# Patient Record
Sex: Male | Born: 1975 | Race: White | Hispanic: No | Marital: Single | Smoking: Never smoker
Health system: Southern US, Community
[De-identification: ages and names within clinical notes are randomized; demographics above are authoritative.]

## PROBLEM LIST (undated history)

## (undated) DIAGNOSIS — F419 Anxiety disorder, unspecified: Secondary | ICD-10-CM

## (undated) DIAGNOSIS — J189 Pneumonia, unspecified organism: Secondary | ICD-10-CM

## (undated) HISTORY — PX: SEPTOPLASTY: SUR1290

## (undated) HISTORY — DX: Pneumonia, unspecified organism: J18.9

## (undated) HISTORY — PX: MYRINGOTOMY: SUR874

---

## 2001-05-08 ENCOUNTER — Inpatient Hospital Stay (HOSPITAL_COMMUNITY): Admission: EM | Admit: 2001-05-08 | Discharge: 2001-05-11 | Payer: Self-pay | Admitting: Internal Medicine

## 2001-06-24 ENCOUNTER — Encounter: Payer: Self-pay | Admitting: Urology

## 2001-06-24 ENCOUNTER — Encounter: Admission: RE | Admit: 2001-06-24 | Discharge: 2001-06-24 | Payer: Self-pay | Admitting: Urology

## 2001-11-19 ENCOUNTER — Ambulatory Visit (HOSPITAL_COMMUNITY): Admission: RE | Admit: 2001-11-19 | Discharge: 2001-11-19 | Payer: Self-pay | Admitting: Urology

## 2004-12-07 ENCOUNTER — Ambulatory Visit: Payer: Self-pay | Admitting: Family Medicine

## 2004-12-28 ENCOUNTER — Ambulatory Visit: Payer: Self-pay | Admitting: Family Medicine

## 2005-03-02 ENCOUNTER — Ambulatory Visit: Payer: Self-pay | Admitting: Family Medicine

## 2007-11-26 ENCOUNTER — Ambulatory Visit (HOSPITAL_BASED_OUTPATIENT_CLINIC_OR_DEPARTMENT_OTHER): Admission: RE | Admit: 2007-11-26 | Discharge: 2007-11-26 | Payer: Self-pay | Admitting: Otolaryngology

## 2008-04-03 ENCOUNTER — Ambulatory Visit: Payer: Self-pay | Admitting: Cardiovascular Disease

## 2008-04-11 ENCOUNTER — Emergency Department (HOSPITAL_COMMUNITY): Admission: EM | Admit: 2008-04-11 | Discharge: 2008-04-11 | Payer: Self-pay | Admitting: Emergency Medicine

## 2008-06-10 ENCOUNTER — Ambulatory Visit: Payer: Self-pay | Admitting: Gastroenterology

## 2008-06-10 DIAGNOSIS — R079 Chest pain, unspecified: Secondary | ICD-10-CM

## 2008-06-10 DIAGNOSIS — K219 Gastro-esophageal reflux disease without esophagitis: Secondary | ICD-10-CM

## 2008-06-12 ENCOUNTER — Encounter: Payer: Self-pay | Admitting: Gastroenterology

## 2008-06-12 DIAGNOSIS — R7989 Other specified abnormal findings of blood chemistry: Secondary | ICD-10-CM | POA: Insufficient documentation

## 2008-06-12 LAB — CONVERTED CEMR LAB
ALT: 62 units/L — ABNORMAL HIGH (ref 0–53)
AST: 41 units/L — ABNORMAL HIGH (ref 0–37)
Albumin: 4.7 g/dL (ref 3.5–5.2)
Alkaline Phosphatase: 65 units/L (ref 39–117)
BUN: 14 mg/dL (ref 6–23)
Basophils Absolute: 0 10*3/uL (ref 0.0–0.1)
Basophils Relative: 0.6 % (ref 0.0–3.0)
CO2: 30 meq/L (ref 19–32)
Calcium: 9.9 mg/dL (ref 8.4–10.5)
Chloride: 102 meq/L (ref 96–112)
Creatinine, Ser: 1.1 mg/dL (ref 0.4–1.5)
Eosinophils Absolute: 0.2 10*3/uL (ref 0.0–0.7)
Eosinophils Relative: 3.1 % (ref 0.0–5.0)
GFR calc Af Amer: 100 mL/min
GFR calc non Af Amer: 83 mL/min
Glucose, Bld: 100 mg/dL — ABNORMAL HIGH (ref 70–99)
HCT: 45.1 % (ref 39.0–52.0)
Hemoglobin: 16.1 g/dL (ref 13.0–17.0)
Lymphocytes Relative: 33.6 % (ref 12.0–46.0)
MCHC: 35.8 g/dL (ref 30.0–36.0)
MCV: 89.5 fL (ref 78.0–100.0)
Monocytes Absolute: 0.6 10*3/uL (ref 0.1–1.0)
Monocytes Relative: 9 % (ref 3.0–12.0)
Neutro Abs: 4 10*3/uL (ref 1.4–7.7)
Neutrophils Relative %: 53.7 % (ref 43.0–77.0)
Platelets: 251 10*3/uL (ref 150–400)
Potassium: 4.2 meq/L (ref 3.5–5.1)
RBC: 5.04 M/uL (ref 4.22–5.81)
RDW: 12.7 % (ref 11.5–14.6)
Sodium: 140 meq/L (ref 135–145)
Total Bilirubin: 0.7 mg/dL (ref 0.3–1.2)
Total Protein: 7.9 g/dL (ref 6.0–8.3)
WBC: 7.2 10*3/uL (ref 4.5–10.5)

## 2008-06-15 ENCOUNTER — Ambulatory Visit: Payer: Self-pay | Admitting: Cardiology

## 2008-06-15 ENCOUNTER — Ambulatory Visit: Payer: Self-pay | Admitting: Gastroenterology

## 2008-06-17 ENCOUNTER — Ambulatory Visit (HOSPITAL_COMMUNITY): Admission: RE | Admit: 2008-06-17 | Discharge: 2008-06-17 | Payer: Self-pay | Admitting: Gastroenterology

## 2008-06-17 ENCOUNTER — Ambulatory Visit: Payer: Self-pay | Admitting: Gastroenterology

## 2008-06-19 LAB — CONVERTED CEMR LAB
Ferritin: 332.4 ng/mL — ABNORMAL HIGH (ref 22.0–322.0)
Saturation Ratios: 50.8 % — ABNORMAL HIGH (ref 20.0–50.0)

## 2008-06-22 ENCOUNTER — Telehealth (INDEPENDENT_AMBULATORY_CARE_PROVIDER_SITE_OTHER): Payer: Self-pay | Admitting: *Deleted

## 2008-06-22 LAB — CONVERTED CEMR LAB
Hep A IgM: NEGATIVE
Hep A Total Ab: NEGATIVE
Hep B S Ab: NEGATIVE

## 2008-06-23 ENCOUNTER — Ambulatory Visit: Payer: Self-pay

## 2008-06-23 ENCOUNTER — Encounter: Payer: Self-pay | Admitting: Cardiovascular Disease

## 2008-06-23 ENCOUNTER — Ambulatory Visit: Payer: Self-pay | Admitting: Cardiovascular Disease

## 2008-06-30 ENCOUNTER — Ambulatory Visit: Payer: Self-pay | Admitting: Gastroenterology

## 2008-07-07 ENCOUNTER — Telehealth: Payer: Self-pay | Admitting: Gastroenterology

## 2008-07-28 ENCOUNTER — Ambulatory Visit: Payer: Self-pay | Admitting: Gastroenterology

## 2008-07-28 DIAGNOSIS — R740 Nonspecific elevation of levels of transaminase and lactic acid dehydrogenase [LDH]: Secondary | ICD-10-CM

## 2008-07-28 LAB — CONVERTED CEMR LAB
INR: 1 (ref 0.8–1.0)
Prothrombin Time: 12.5 s (ref 10.9–13.3)

## 2008-07-29 LAB — CONVERTED CEMR LAB
AST: 51 units/L — ABNORMAL HIGH (ref 0–37)
Alkaline Phosphatase: 58 units/L (ref 39–117)
Total Bilirubin: 0.9 mg/dL (ref 0.3–1.2)

## 2008-07-30 ENCOUNTER — Telehealth: Payer: Self-pay | Admitting: Gastroenterology

## 2008-08-05 ENCOUNTER — Ambulatory Visit (HOSPITAL_COMMUNITY): Admission: RE | Admit: 2008-08-05 | Discharge: 2008-08-05 | Payer: Self-pay | Admitting: Gastroenterology

## 2008-08-05 ENCOUNTER — Encounter (INDEPENDENT_AMBULATORY_CARE_PROVIDER_SITE_OTHER): Payer: Self-pay | Admitting: Interventional Radiology

## 2008-08-17 ENCOUNTER — Telehealth: Payer: Self-pay | Admitting: Gastroenterology

## 2008-09-10 ENCOUNTER — Ambulatory Visit: Payer: Self-pay | Admitting: Gastroenterology

## 2008-09-15 LAB — CONVERTED CEMR LAB
ALT: 81 U/L — ABNORMAL HIGH (ref 0–53)
AST: 40 U/L — ABNORMAL HIGH (ref 0–37)
Albumin: 4.4 g/dL (ref 3.5–5.2)
Alkaline Phosphatase: 63 U/L (ref 39–117)
Bilirubin, Direct: 0.1 mg/dL (ref 0.0–0.3)
Total Bilirubin: 0.7 mg/dL (ref 0.3–1.2)
Total Protein: 8.2 g/dL (ref 6.0–8.3)

## 2008-09-16 ENCOUNTER — Ambulatory Visit: Payer: Self-pay | Admitting: Gastroenterology

## 2008-09-17 LAB — CONVERTED CEMR LAB: Hgb A1c MFr Bld: 5.3 % (ref 4.6–6.0)

## 2008-12-21 IMAGING — US US ABDOMEN COMPLETE
1 series · 14 of 25 positions shown · non-contrast
Comparison: None

CLINICAL DATA: Nausea.  Abnormal liver function tests.

ABDOMEN ULTRASOUND
TECHNIQUE: Complete abdominal ultrasound examination was performed
including evaluation of the liver, gallbladder, bile ducts,
pancreas, kidneys, spleen, IVC, and abdominal aorta.

[Series 1: unknown · 0.27mm/px · 14 of 63 slices shown]
[im 1/63]
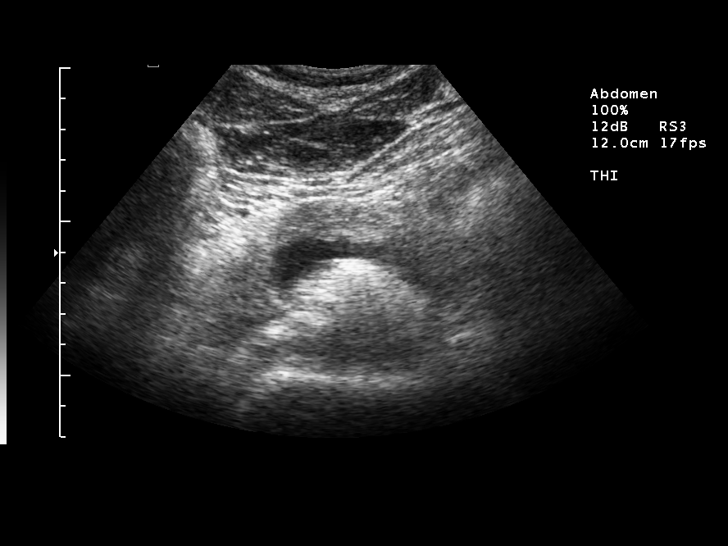
[im 6/63]
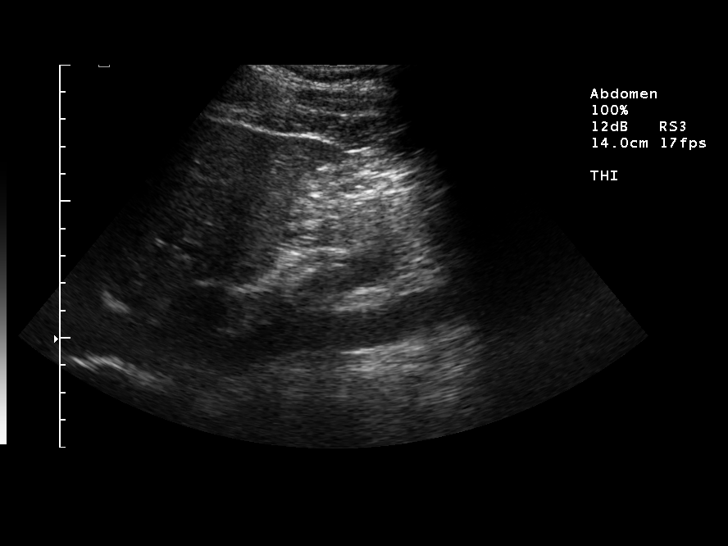
[im 11/63]
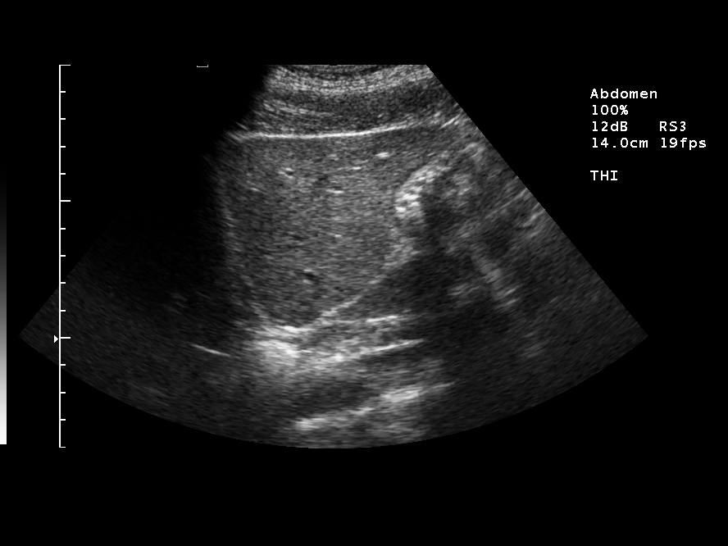
[im 16/63]
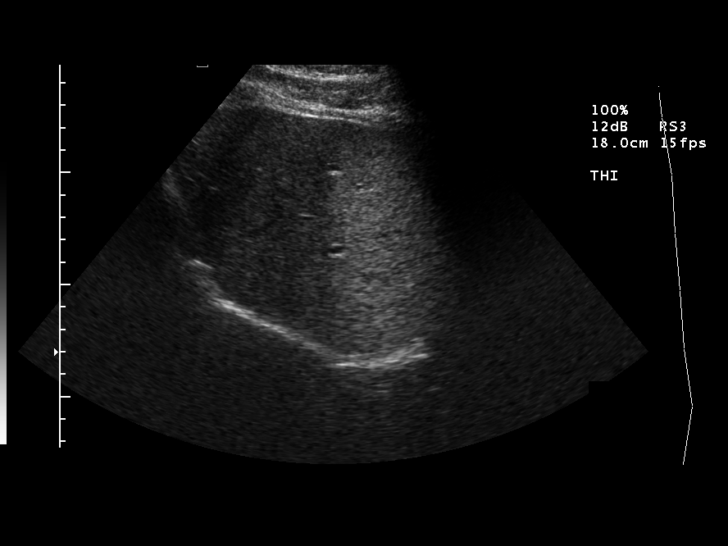
[im 21/63]
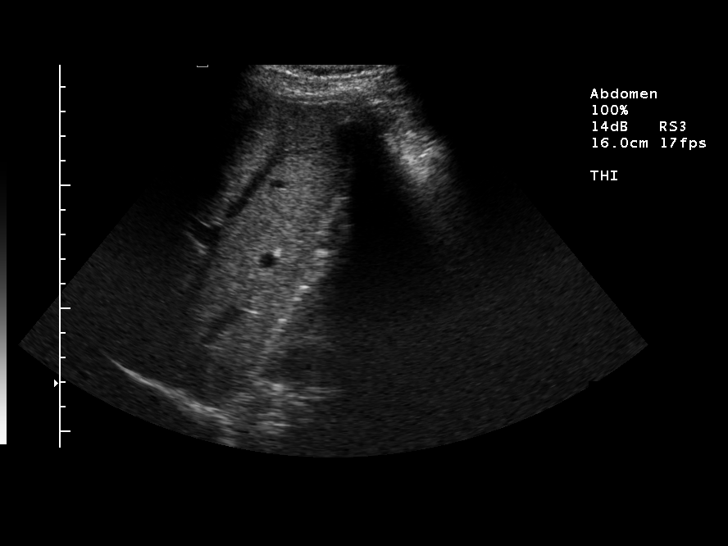
[im 24/63]
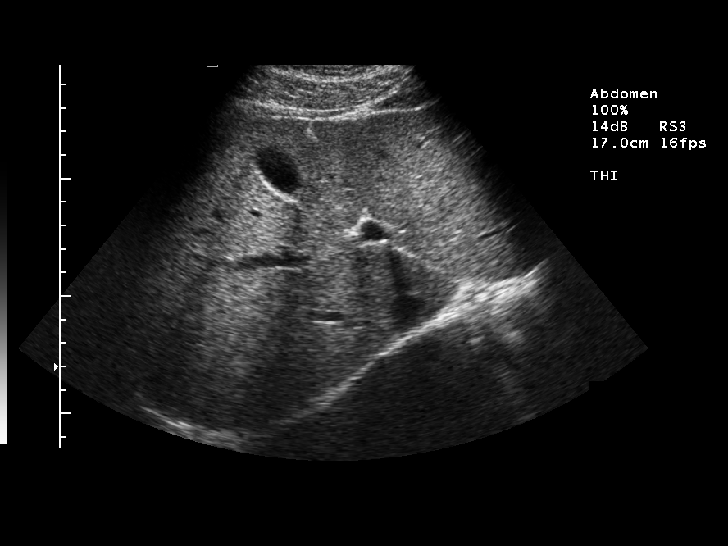
[im 29/63]
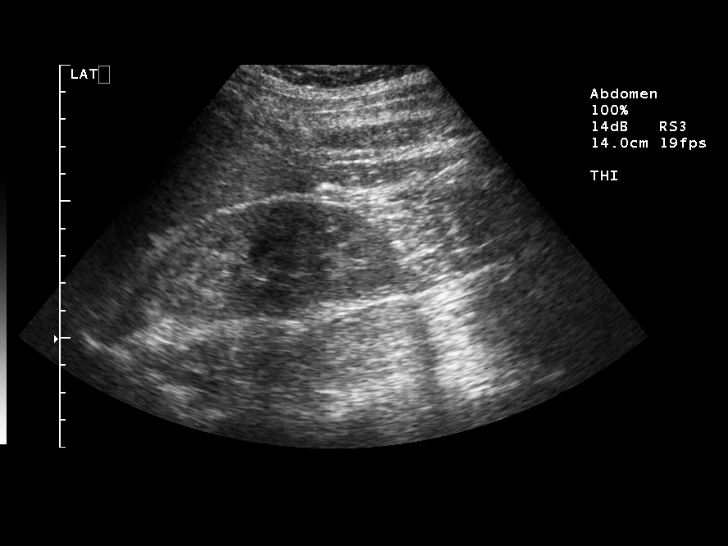
[im 34/63]
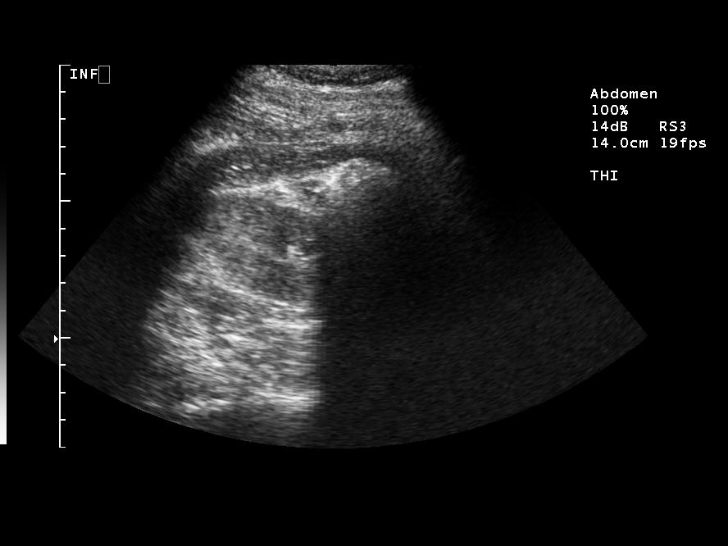
[im 39/63]
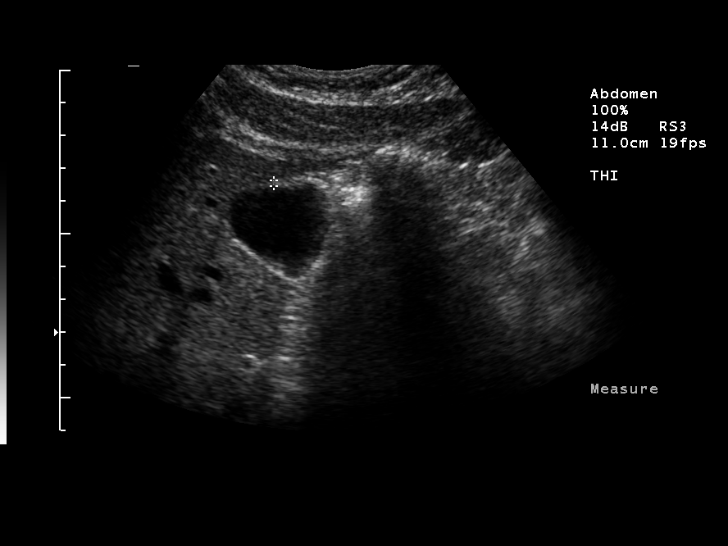
[im 42/63]
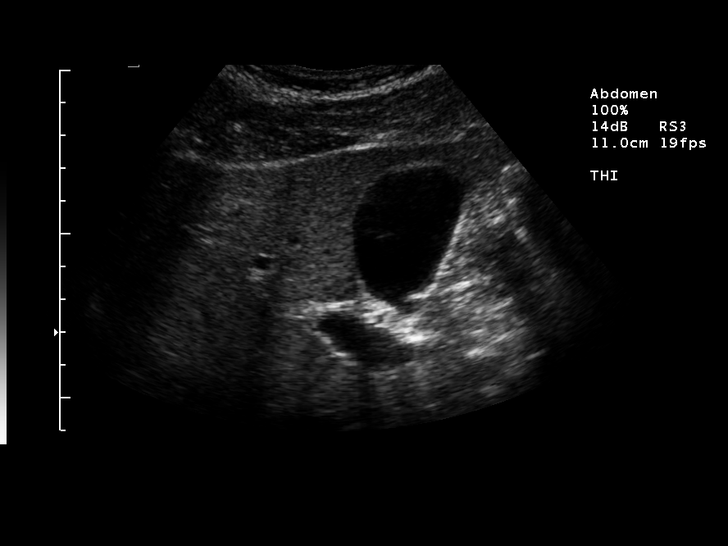
[im 47/63]
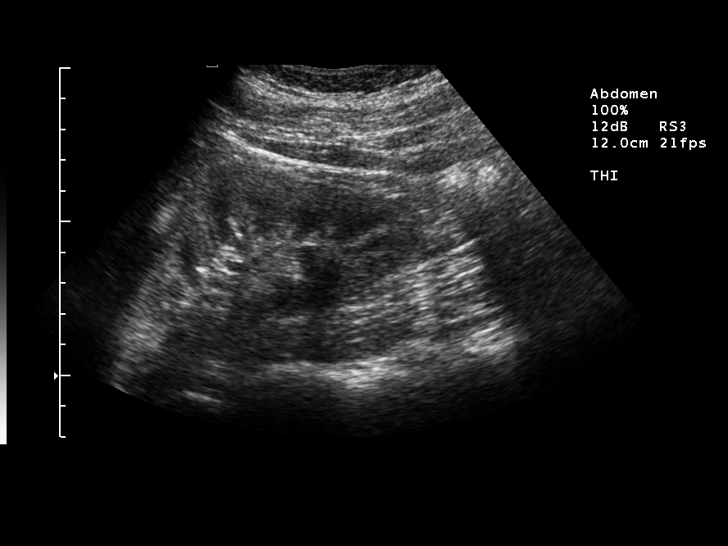
[im 52/63]
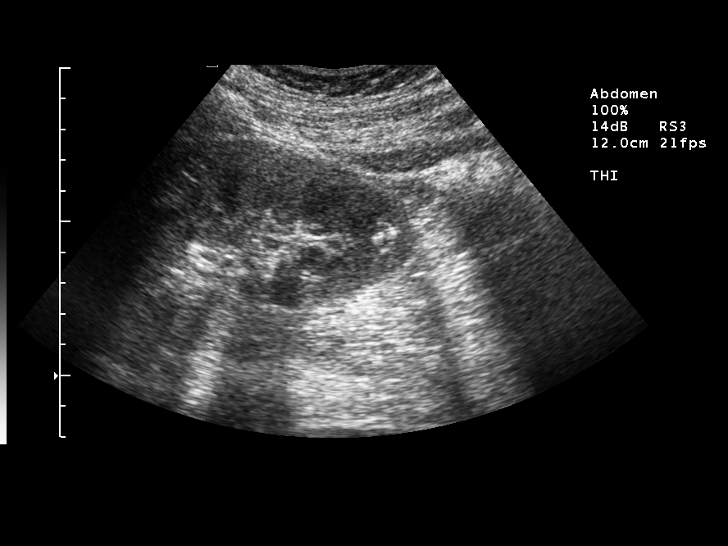
[im 57/63]
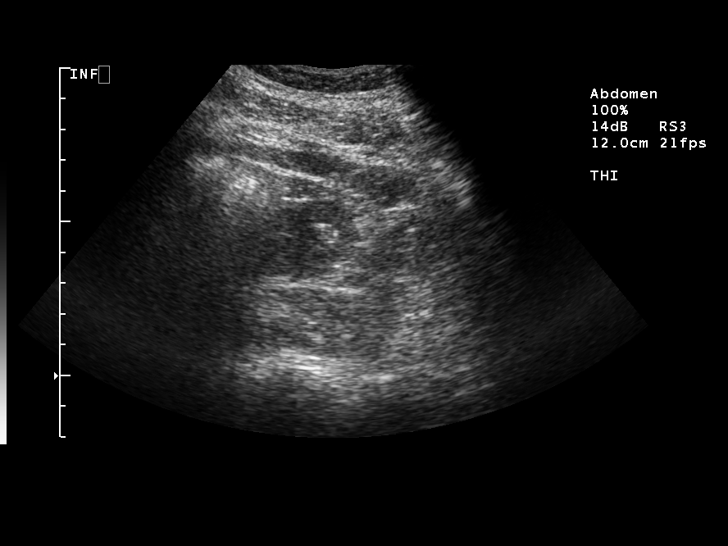
[im 63/63]
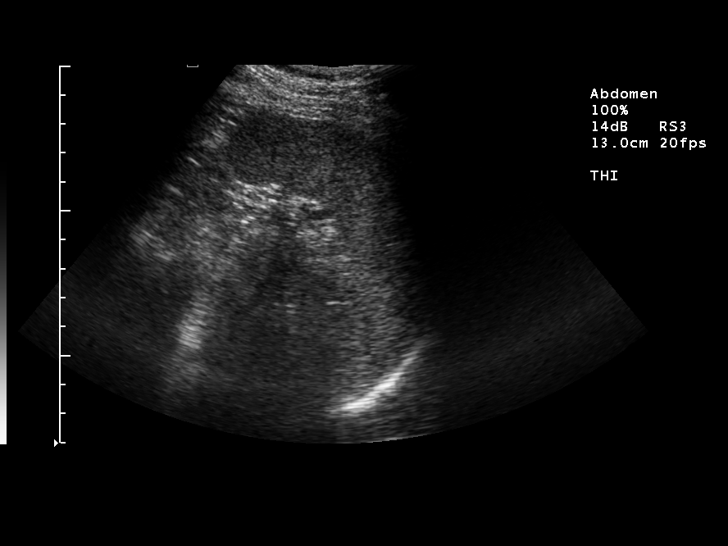

[14 of 25 positions shown; findings below may reference images not displayed]

FINDINGS: The liver has a normal appearance with normal
echogenicity, no focal lesions or biliary ductal dilatation.  The
gallbladder is normal without stones, sludge or wall thickening.
Common duct is normal at 3 mm.  The spleen and pancreas are normal.
Both kidneys are normal, the right measuring 11.4 cm in the left
measuring 12.0 cm .  The aorta and IVC are normal.  No ascites.
IMPRESSION: Normal examination

## 2009-03-19 ENCOUNTER — Telehealth (INDEPENDENT_AMBULATORY_CARE_PROVIDER_SITE_OTHER): Payer: Self-pay | Admitting: *Deleted

## 2011-03-28 NOTE — Assessment & Plan Note (Signed)
[facility]                            CARDIOLOGY OFFICE NOTE   Jared Parsons, Jared Parsons                      MRN:          [id number]  DATE:06/23/2008                            DOB:          Jun 11, 1976    Jared Parsons returns today in followup.  He has history of anxiety and  panic attacks, atypical chest pain and shortness of breath.  Apparently  he has seen Dr. Christella Hartigan.  There is a question of whether his iron levels  are high.  He said that he had a CT scan and needs followup blood work.  He had an endoscopy, which showed mild inflammation.  I do not have  reports on any of this.  His chest pain was atypical and likely related  to anxiety.  His blood pressure was borderline elevated.  I reviewed his  stress echo at length including the images, treadmill, and hemodynamic  response.  He did not have a hypertensive response with a peak blood  pressure of only 160 systolic.  Regional wall motions were normal.  EF  was normal at 65%.  There was no evidence hemochromatosis in the heart.   Blood pressure readings from home were examined.  They were also normal.  I reassured Jared Parsons about his symptoms.  I did not think that his chest  pain or dyspnea were related to his heart.  I think they are more  related to anxiety attacks, which he has had in the past.   His symptoms seem to be improved with the good news.   He did not fill as prescription for lisinopril.  He is supposed to be on  Nexium, but has not got this filled yet.   He also takes vitamin E and fish oil.   His exam is remarkable for a healthy-appearing young white male in no  distress.  He has braces on his teeth.  Blood pressure is 130/80, pulse  70 and regular, respiratory rate 14, afebrile.  HEENT is unremarkable.  Carotids normal without bruit.  No lymphadenopathy, thyromegaly, or JVP  elevation.  Lungs are clear.  Good diaphragmatic motion.  No wheezing.  Cardiac, S1 and S2.  Normal heart  sounds.  PMI normal.  Abdomen is  benign.  Bowel sounds positive.  No AAA.  No tenderness.  No bruit.  No  hepatosplenomegaly or hepatojugular reflux.  Extremities, distal pulses  intact.  No edema.  Neuro is nonfocal.  Skin is warm and dry.  Musculoskeletal, no muscular weakness.   IMPRESSION:  1. Chest pain atypical.  Normal stress echocardiogram.  Follow      clinically.  2. Dyspnea, normal left ventricular function, likely related to panic      attacks.  3. History of reflux with gastritis.  Follow up with Dr. Christella Hartigan.      Continue H2 blocker.  4. Question what sounds like hemochromatosis.  Followup ferritin and      iron binding studies.  Follow up with GI.   Overall I think Jared Parsons is doing fine.  He will be seen on a p.r.n.  basis in Cardiology.  Noralyn Pick. Eden Emms, MD, [facility]  Electronically Signed    PCN/MedQ  DD: 06/23/2008  DT: 06/24/2008  Job #: [id number]

## 2011-03-28 NOTE — Op Note (Signed)
NAMEKRISTINE, TILEY               ACCOUNT NO.:  [id number]   MEDICAL RECORD NO.:  [id number]          PATIENT TYPE:  AMB   LOCATION:  DSC                          FACILITY:  [facility]   PHYSICIAN:  Christopher E. 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   POSTOPERATIVE DIAGNOSIS:  Septal deviation with turbinate hypertrophy  and nasal obstruction.   OPERATION PERFORMED:  Septoplasty with bilateral inferior turbinate  reductions.   SURGEON:  Kristine Garbe. Ezzard Standing, M.D.   ANESTHESIA:  General endotracheal.   COMPLICATIONS:  None.   CLINICAL NOTE:  Jared Parsons is a 35 year old gentleman who has had  longstanding problems with nasal obstruction.  He has a tendency to use  frequent over-the-counter decongestant nasal sprays as he has found this  is the effective to help him breathe.  He has tried nasal steroid sprays  without significant benefit.  He has had a previous history of nasal  trauma and on examination has a moderate septal deflection with  turbinate hypertrophy.  He is taken to the operating room at this time  for septoplasty and turbinate reduction to help him improve his nasal  airway and breathing.   DESCRIPTION OF PROCEDURE:  After adequate endotracheal anesthesia, the  patient received 1 g of Ancef IV preoperatively.  The nose was then  further prepped with cotton pledgets soaked in decongestant and the nose  was prepped with Betadine.  The septum and turbinates were injected with  Xylocaine with epinephrine for hemostasis and a hemitransfixion incision  was made along the caudal edge of the septum on the right side.  Mucoperichondrial and mucoperiosteal were flaps elevated posteriorly.  The patient had a slight the anterior cartilaginous septum deviation to  the left with  some of the cartilage bowed off of the maxillary crest  into the right airway.  The anterior 1-2 mm of caudal septum was excised  from the distal end of the septum.  The portion of the cartilaginous  septum that protruded into the right airway was removed off the  maxillary crest on the right side.  More posteriorly some of the bony  septum protruded more to the left side.  Now after mucoperichondrial and  mucoperiosteal flaps were elevated on either side of the bony septum,  this was removed.  This allowed the septum to return more to midline.  Next turbinate reductions were performed.  Incision was made along the  inferior edge of the turbinate.  Mucosa was elevated off of the medial  aspect of the turbinate bone and then turbinate bone and lateral  turbinate mucosa was amputated.  The remaining turbinate tissue was  outfractured and cauterized with suction cautery for hemostasis.  The  hemitransfixion incision was closed with interrupted 4-0 chromic  sutures.  The septum was basted with a 4-0 chromic suture and then packs  were placed on either side of the septum made of Telfa soaked in  bacitracin ointment.  This completed the procedure.  Nikholas was awoken  from anesthesia and transferred to the recovery room postop doing well.   DISPOSITION:  Jared Parsons is discharged home later this morning on  Tylenol, Vicodin and Darvocet p.r.n. pain, Keflex 500 mg b.i.d. of for 1  week.  He will follow up in my office tomorrow to have his nasal packs  removed.           ______________________________  Kristine Garbe Ezzard Standing, M.D.     CEN/MEDQ  D:  11/26/2007  T:  11/26/2007  Job:  [id number]   cc:   Delaney Meigs, M.D.

## 2011-03-28 NOTE — Assessment & Plan Note (Signed)
[facility]                            CARDIOLOGY OFFICE NOTE   Jared Parsons, Jared Parsons                      MRN:          [id number]  DATE:04/03/2008                            DOB:          05/13/1976    Jared Parsons is seen today as a new patient.  He is 35 years old.  He is  referred for chest pain.  His chest pain is atypical.  He has a previous  history of reflux.  The pain is a central pressure type, it is not  necessarily related to exertion.  He can be made worse by laying flat.  He seems to have some improvement with antacids at times.   The patient has not had a previous endoscopy or gastrin level.   He is a heavy weightlifter, but clearly the pain does not appear to be  musculoskeletal in nature.  After talking to him for a while, it  appeared that he is having panic attacks.  He gets a bit anxious.  He  has air hunger, pressure in his chest and can sometimes feel lightheaded  or diaphoretic.  This can also occur while he is driving.   I tried to reassure him about his heart.  He does have an abnormal lipid  profile, although his LDL is only 113.  His particle number is elevated  at 2024.  His particle size is also populated by small LDL at 1703.  I  told him that his lipomed profile needs to be reinterpreted as it was  listed as a male patient.   The patient also appears to be borderline hypertensive.  He has a fairly  decent diet and does not overload with salt.  His blood pressure reading  was elevated on one occasion when he felt dizzy and is elevated in the  office today.   His risk factors are remarkable for family history, abnormal lipid  profile.  He is a nonsmoker, nondiabetic.   His symptoms actually started back in July 2008.  He saw a doctor in  November 2009.  On both occasions, it was thought that he was having  more reflux symptoms.  He subsequently had a very bad episode on March 11, 2008, while watching the [place]  race.  This was associated with  air hunger and symptoms that appeared to be more associated with an  anxiety attack.   The patient's review of systems is otherwise negative, particularly he  has not had frank syncope, palpitations or lower extremity edema.   PAST SURGICAL HISTORY:  Includes previous testicular torsion with  implant.  Nasal surgery on November 26, 2007.  He occasionally uses  Veramyst.  He had a septoplasty.   The patient is single.  He has a girlfriend.  He manages two apartment  complexes.  He does not smoke or abuse energy drinks.  I specifically  asked him about steroids or supplements since he is a heavy weight  lifter and he denied using them.   PAST MEDICAL HISTORY:  Also remarkable for a low vitamin D level for  which he is  on supplementation for.   FAMILY HISTORY:  Remarkable for premature coronary disease on the  father's side.   MEDICATIONS:  Over-the-counter acid reducer, fish oil and vitamin D.   ALLERGIES:  HE HAS A ALLERGY TO SLEEPING MEDS, I SUSPECT THAT IS UNDUE  SOMNOLENCE.   PHYSICAL EXAMINATION:  GENERAL:  Remarkable for a well-developed white  male in no distress.  He has braces on.  VITAL SIGNS:  Blood pressure is 156/97, weight is 186, pulse is 80,  afebrile, respiratory rate 14.  HEENT:  Unremarkable.  NECK:  Carotids normal without bruit.  No lymphadenopathy, thyromegaly  or JVP elevation.  LUNGS:  Clear.  Good diaphragmatic motion.  No wheezing.  CARDIOVASCULAR:  S1-S2, normal heart sounds.  PMI normal.  ABDOMEN:  Benign.  Bowel sounds positive.  No AAA, no tenderness, no  bruit, no hepatosplenomegaly or hepatojugular reflux.  EXTREMITIES:  No edema.  NEURO:  Nonfocal.  SKIN:  Warm and dry.  No muscular weakness.   Baseline EKG is normal.   IMPRESSION:  1. Atypical chest pain.  Follow-up stress echo.  2. Probable anxiety attacks.  Follow up with [facility].  Consider therapy with antidepressant or  anxiolytic.  3. Reflux.  Continue over-the-counter H2 blocker.  4. Abnormal lipid profile.  The patient's lipid profile is actually      quite abnormal despite an LDL of 113.  A consideration for statin      drug therapy should be given, low cholesterol diet.  5. Question hypertension.  The patient was encouraged to get a blood      pressure monitor.  I will see him back in 8 weeks to go over these.      I told him to take his readings 3-4 times a week.  I gave him a      script for lisinopril 10 mg, and told him not to start it until we      see what his blood pressure runs at home.  Alternatively, we could      consider a beta-blocker in regards to helping his anxiety attacks,      but I am currently not 100% sure of his symptoms of air hunger and      given his previous history of testicular torsion, he may have some      issues in regards to sexual function on a beta-blocker.   I will see him back in 8 weeks when he has his stress echo and reassess  his blood pressure.     Noralyn Pick. Eden Emms, MD, [facility]  Electronically Signed   PCN/MedQ  DD: 04/03/2008  DT: 04/03/2008  Job #: [id number]   cc:   S. Kyra Manges, M.D.

## 2011-03-31 NOTE — Op Note (Signed)
[facility]  Patient:    Jared Parsons, Jared Parsons Visit Number: [id number] MRN: [id number]          Service Type: DSU Location: DAY Attending Physician:  Tania Ade Dictated by:   Lucrezia Starch. Ovidio Hanger, M.D. Proc. Date: 11/19/01 Admit Date:  11/19/2001                             Operative Report  PREOPERATIVE DIAGNOSIS:  Absent left testicle.  POSTOPERATIVE DIAGNOSIS:  Absent left testicle.  OPERATION PERFORMED:  Placement of left testicular prosthesis.  SURGEON:  Lucrezia Starch. Ovidio Hanger, M.D.  ANESTHESIA:  General endotracheal.  ESTIMATED BLOOD LOSS:  15 cc  TUBES:  None.  PROSTHESIS:  A mentor saline filled medium prosthesis.  COMPLICATIONS:  None.  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 He has been considering having a prosthesis and has finally made the decision. After understanding the risks, benefits, and alternatives, he has elected to proceed.  DESCRIPTION OF PROCEDURE:  The patient was placed in the supine position and after proper general endotracheal anesthesia was prepped and draped with Betadine in a sterile fashion. An oblique incision was made at the neck of the scrotum, sharp dissection was carried down through the subcutaneous tissue and a subdartal pouch was created into the scrotal sac. Good hemostasis was noted to be present. A saline filled medium mentor prosthesis was filled at the appropriate volume and a 4-0 Prolene was placed figure-of-eight stitch in the dartos tunic area attached to the prosthesis and the prosthesis was sutured in place. We were comfortable with the localization and the line. The neck of the scrotal sac was closed with a pursestring 3-0 chromic catgut, the subcutaneous tissue was closed with a running 3-0 chromic catgut and the skin was  closed with a running horizontal mattress #0 chromic catgut and dressed with dermabond flush for scrotal support and bacitracin was placed. The patient tolerated the procedure well with no complications and was taken to the recovery room stable. Dictated by:   Lucrezia Starch. Ovidio Hanger, M.D. Attending Physician:  Tania Ade DD:  11/19/01 TD:  11/20/01 Job: [id number]

## 2011-08-09 LAB — INFLUENZA A+B VIRUS AG-DIRECT(RAPID): Influenza B Ag: NEGATIVE

## 2011-08-14 LAB — CBC
Platelets: 228
RBC: 5.34
WBC: 5.6

## 2011-08-14 LAB — PROTIME-INR
INR: 1
Prothrombin Time: 13.6

## 2013-08-15 ENCOUNTER — Ambulatory Visit: Payer: Self-pay | Admitting: Family Medicine

## 2016-02-24 ENCOUNTER — Encounter: Payer: Self-pay | Admitting: Gastroenterology

## 2016-12-19 ENCOUNTER — Telehealth: Payer: Self-pay | Admitting: Gastroenterology

## 2016-12-19 NOTE — Telephone Encounter (Signed)
Noted  

## 2016-12-19 NOTE — Telephone Encounter (Signed)
Dr Christella HartiganJacobs please review for further recommendations.  Is it ok for him to have appt with Gunnar FusiPaula as scheduled or should he be seen in the ED?

## 2016-12-19 NOTE — Telephone Encounter (Signed)
Pt feels like something is stuck in the throat and is not sure if he swallowed a chicken bone in January.  Since then he has had a feeling of something stuck, dysphagia of solids.  He has no problems breathing, and can swallow liquids and solids if he eats slowly.  He was scheduled to see Gunnar Fusiaula on 12/21/16 and was advised if he develops significant pain, or trouble breathing or worsening dysphagia he should go to the ED for eval.  Pt agreed and verbalized understanding.

## 2016-12-19 NOTE — Telephone Encounter (Signed)
It would be very unusual to have something caught in the esophagus for that long.  I think it is safe for OV in 2 days with Gunnar FusiPaula.

## 2016-12-21 ENCOUNTER — Emergency Department (HOSPITAL_COMMUNITY)
Admission: EM | Admit: 2016-12-21 | Discharge: 2016-12-21 | Disposition: A | Discharge status: Home / Self Care | Payer: Self-pay | Attending: Dermatology | Admitting: Dermatology

## 2016-12-21 ENCOUNTER — Encounter (INDEPENDENT_AMBULATORY_CARE_PROVIDER_SITE_OTHER): Payer: Self-pay

## 2016-12-21 ENCOUNTER — Encounter (HOSPITAL_COMMUNITY): Payer: Self-pay | Admitting: Emergency Medicine

## 2016-12-21 ENCOUNTER — Ambulatory Visit (INDEPENDENT_AMBULATORY_CARE_PROVIDER_SITE_OTHER): Payer: BLUE CROSS/BLUE SHIELD | Admitting: Nurse Practitioner

## 2016-12-21 ENCOUNTER — Telehealth: Payer: Self-pay | Admitting: Nurse Practitioner

## 2016-12-21 ENCOUNTER — Encounter: Payer: Self-pay | Admitting: Nurse Practitioner

## 2016-12-21 VITALS — BP 150/70 | HR 97 | Ht 71.0 in | Wt 177.6 lb

## 2016-12-21 DIAGNOSIS — R1310 Dysphagia, unspecified: Secondary | ICD-10-CM

## 2016-12-21 DIAGNOSIS — Z5321 Procedure and treatment not carried out due to patient leaving prior to being seen by health care provider: Secondary | ICD-10-CM | POA: Insufficient documentation

## 2016-12-21 NOTE — ED Notes (Signed)
Patient has left with family, stating they have a MD appointment around 1500.

## 2016-12-21 NOTE — Patient Instructions (Signed)
If you are age 41 or older, your body mass index should be between 23-30. Your Body mass index is 24.77 kg/m. If this is out of the aforementioned range listed, please consider follow up with your Primary Care Provider.  If you are age 10164 or younger, your body mass index should be between 19-25. Your Body mass index is 24.77 kg/m. If this is out of the aformentioned range listed, please consider follow up with your Primary Care Provider.   You have been scheduled for an endoscopy. Please follow written instructions given to you at your visit today. If you use inhalers (even only as needed), please bring them with you on the day of your procedure. Your physician has requested that you go to www.startemmi.com and enter the access code given to you at your visit today. This web site gives a general overview about your procedure. However, you should still follow specific instructions given to you by our office regarding your preparation for the procedure.  Thank you.

## 2016-12-21 NOTE — Progress Notes (Addendum)
      HPI:  Patient is a 41 yo male presenting with a one month history of dysphagia and chest discomfort. Patient had an EGD in 2009 by Dr. Christella HartiganJacobs for chest pain. Esophagitis was found. He took a PPI for a short time then no longer needed it as symptoms resolved.   The beginning of January patient had acute onset of dysphagia while eating chicken. Patient initially thought he swallowed a chicken bone. He has had significant difficulty swallowing solids since. He feels like solids are stacking up in his esophagus. Liquids are okay. No odynophagia but he does have non-exertional chest discomfort radiating through to his shoulders. No recent antibiotics to suggest candida esophagitis. Patient thought chest discomfort and dysphagia could be secondary to GERD so he started himself on Zantac without relief. He added a liquid antiacid and PCP added Protonix but nothing helps. Patient has had minor weight loss because of the dysphagia.    Patient considers himself healthy. He is physically active, no significant ETOH use. Other than dysphagia patient has no complaints.    Past Medical History:  Diagnosis Date  . Pneumonia     Past Surgical History:  Procedure Laterality Date  . MYRINGOTOMY    . SEPTOPLASTY     Family History  Problem Relation Age of Onset  . Prostate cancer Maternal Grandfather    Social History  Substance Use Topics  . Smoking status: Never Smoker  . Smokeless tobacco: Never Used  . Alcohol use No   Current Outpatient Prescriptions  Medication Sig Dispense Refill  . pantoprazole (PROTONIX) 40 MG tablet Take 40 mg by mouth daily.    Marland Kitchen. SIMETHICONE EXTRA STRENGTH PO Take by mouth. As needed     No current facility-administered medications for this visit.    No Known Allergies   Review of Systems: All systems reviewed and negative except where noted in HPI.    Physical Exam: BP (!) 150/70   Pulse 97   Ht 5\' 11"  (1.803 m)   Wt 177 lb 9.6 oz (80.6 kg)   SpO2  98%   BMI 24.77 kg/m  Constitutional:  Well-developed, white  male in no acute distress. Psychiatric: Normal mood and affect. Behavior is normal. EENT:. Conjunctivae are normal. No scleral icterus. Neck supple. No masses felt.   Cardiovascular: Normal rate, regular rhythm.  Pulmonary/chest: Effort normal and breath sounds normal. No wheezing, rales or rhonchi. Abdominal: Soft, nondistended, nontender. Bowel sounds active throughout. There are no masses palpable. No hepatomegaly. Extremities: no edema Lymphadenopathy: No cervical adenopathy noted. Neurological: Alert and oriented to person place and time. Skin: Skin is warm and dry. No rashes noted.   ASSESSMENT AND PLAN:  41 year old male with a one month history of solid food dysphagia and non-exertional chest discomfort which started acutely while eating chicken. Patient is concerned he may have swallowed a bone. No improvement with zantac PPI and a liquid antiacid.  -Patient needs an EGD. Patient known to Dr. Christella HartiganJacobs but in his absence Dr. Myrtie Neitheranis has kindly offered to do the procedure in the am. The risks and benefits of EGD were discussed and the patient agrees to proceed.    Willette ClusterPaula Dacoda Finlay, NP  12/21/2016, 9:33 PM  Thank you for sending this case to me and having me this patient with you in clinic today. I have reviewed the entire note, and the outlined plan seems appropriate.   Amada JupiterHenry Danis, MD

## 2016-12-21 NOTE — Telephone Encounter (Signed)
Pt was checked in at 2:16pm today. Not sure why message states that he is in the ER

## 2016-12-21 NOTE — ED Triage Notes (Addendum)
Pt from home with complaints of difficulty swallowing x 1 month. Pt states that initially he thought he swallowed a chicken bone. Pt states he saw his PCP and was diagnosed with reflux. Pt stated his symptoms come and go. Pt states symptoms woke him up from his sleep and he mixed baking soda with water and felt some relief. Pt describes his symptoms as his esophagus has something stuck in it. Pt had an appointment with his doctor at 1500 for this. Pt had an episode on his way there and came to the ED instead. Pt's vital signs are WNL. No distress is noted. Neuro exam unremarkable.

## 2016-12-22 ENCOUNTER — Encounter: Payer: Self-pay | Admitting: Gastroenterology

## 2016-12-22 ENCOUNTER — Ambulatory Visit (AMBULATORY_SURGERY_CENTER): Payer: BLUE CROSS/BLUE SHIELD | Admitting: Gastroenterology

## 2016-12-22 VITALS — BP 113/12 | HR 66 | Temp 98.4°F | Resp 18 | Ht 71.0 in | Wt 177.0 lb

## 2016-12-22 DIAGNOSIS — R1310 Dysphagia, unspecified: Secondary | ICD-10-CM | POA: Diagnosis not present

## 2016-12-22 DIAGNOSIS — R1319 Other dysphagia: Secondary | ICD-10-CM

## 2016-12-22 DIAGNOSIS — R634 Abnormal weight loss: Secondary | ICD-10-CM

## 2016-12-22 MED ORDER — SODIUM CHLORIDE 0.9 % IV SOLN: 500.0000 mL | INTRAVENOUS | Status: DC

## 2016-12-22 NOTE — Op Note (Signed)
[facility] Patient Name: Jared LamerShannon Parsons Procedure Date: 12/22/2016 8:15 AM MRN: [id number] Endoscopist: Sherilyn CooterHenry L. Myrtie Neitheranis , MD Age: [age] Referring MD:  Date of Birth: 11-27-1975 Gender: Male Account #: [id number] Procedure:                Upper GI endoscopy Indications:              Dysphagia (over a month, reportedly of acute onset) Medicines:                Monitored Anesthesia Care Procedure:                Pre-Anesthesia Assessment:                           - Prior to the procedure, a History and Physical                            was performed, and patient medications and                            allergies were reviewed. The patient's tolerance of                            previous anesthesia was also reviewed. The risks                            and benefits of the procedure and the sedation                            options and risks were discussed with the patient.                            All questions were answered, and informed consent                            was obtained. Prior Anticoagulants: The patient has                            taken no previous anticoagulant or antiplatelet                            agents. ASA Grade Assessment: II - A patient with                            mild systemic disease. After reviewing the risks                            and benefits, the patient was deemed in                            satisfactory condition to undergo the procedure.                           After obtaining informed consent, the endoscope was  passed under direct vision. Throughout the                            procedure, the patient's blood pressure, pulse, and                            oxygen saturations were monitored continuously. The                            Model GIF-[id number]) scope was introduced                            through the mouth, and advanced to the second part                            of  duodenum. The upper GI endoscopy was                            accomplished without difficulty. The patient                            tolerated the procedure well. Scope In: Scope Out: Findings:                 The larynx was normal.                           The esophagus was normal. Specifically, no                            stricture, ring or mass. The mucosa was entirely                            normal and there was no diltation. No resistance                            passing the scope through the EGJ.                           The stomach was normal.                           The cardia and gastric fundus were normal on                            retroflexion.                           The examined duodenum was normal. Complications:            No immediate complications. Estimated Blood Loss:     Estimated blood loss: none. Impression:               - Normal larynx.                           - Normal esophagus.                           -  Normal stomach.                           - Normal examined duodenum.                           - No specimens collected. Recommendation:           - Patient has a contact number available for                            emergencies. The signs and symptoms of potential                            delayed complications were discussed with the                            patient. Return to normal activities tomorrow.                            Written discharge instructions were provided to the                            patient.                           - Resume previous diet.                           - Continue present medications.                           - Perform ambulatory esophageal manometry at the                            next available appointment. Jared Parsons L. Myrtie Neither, MD 12/22/2016 8:41:18 AM This report has been signed electronically.

## 2016-12-22 NOTE — Patient Instructions (Signed)
YOU HAD AN ENDOSCOPIC PROCEDURE TODAY AT THE [facility]:   Refer to the procedure report that was given to you for any specific questions about what was found during the examination.  If the procedure report does not answer your questions, please call your gastroenterologist to clarify.  If you requested that your care partner not be given the details of your procedure findings, then the procedure report has been included in a sealed envelope for you to review at your convenience later.  YOU SHOULD EXPECT: Some feelings of bloating in the abdomen. Passage of more gas than usual.  Walking can help get rid of the air that was put into your GI tract during the procedure and reduce the bloating. If you had a lower endoscopy (such as a colonoscopy or flexible sigmoidoscopy) you may notice spotting of blood in your stool or on the toilet paper. If you underwent a bowel prep for your procedure, you may not have a normal bowel movement for a few days.  Please Note:  You might notice some irritation and congestion in your nose or some drainage.  This is from the oxygen used during your procedure.  There is no need for concern and it should clear up in a day or so.  SYMPTOMS TO REPORT IMMEDIATELY:    Following upper endoscopy (EGD)  Vomiting of blood or coffee ground material  New chest pain or pain under the shoulder blades  Painful or persistently difficult swallowing  New shortness of breath  Fever of 100F or higher  Black, tarry-looking stools  For urgent or emergent issues, a gastroenterologist can be reached at any hour by calling [phone].   DIET:  We do recommend a small meal at first, but then you may proceed to your regular diet.  Drink plenty of fluids but you should avoid alcoholic beverages for 24 hours.  ACTIVITY:  You should plan to take it easy for the rest of today and you should NOT DRIVE or use heavy machinery until tomorrow (because of the sedation medicines used  during the test).    FOLLOW UP: Our staff will call the number listed on your records the next business day following your procedure to check on you and address any questions or concerns that you may have regarding the information given to you following your procedure. If we do not reach you, we will leave a message.  However, if you are feeling well and you are not experiencing any problems, there is no need to return our call.  We will assume that you have returned to your regular daily activities without incident.  If any biopsies were taken you will be contacted by phone or by letter within the next 1-3 weeks.  Please call us at [phone] if you have not heard about the biopsies in 3 weeks.    SIGNATURES/CONFIDENTIALITY: You and/or your care partner have signed paperwork which will be entered into your electronic medical record.  These signatures attest to the fact that that the information above on your After Visit Summary has been reviewed and is understood.  Full responsibility of the confidentiality of this discharge information lies with you and/or your care-partner.  To have esophageal manometry test as soon as available.  Dr. Myrtie Neitheranis office will contact you with appointment time.

## 2016-12-22 NOTE — Progress Notes (Signed)
To PACU, vss patent aw report to rn 

## 2016-12-25 ENCOUNTER — Telehealth: Payer: Self-pay

## 2016-12-25 NOTE — Telephone Encounter (Signed)
Patient scheduled [facility] outpatient [facility] on 01/10/17 at 10:30. Mailed patient information.

## 2016-12-29 ENCOUNTER — Encounter (HOSPITAL_COMMUNITY): Payer: Self-pay

## 2016-12-29 ENCOUNTER — Ambulatory Visit (HOSPITAL_COMMUNITY): Admit: 2016-12-29 | Payer: BLUE CROSS/BLUE SHIELD | Admitting: Gastroenterology

## 2016-12-29 SURGERY — MANOMETRY, ESOPHAGUS
Anesthesia: Topical

## 2017-01-10 ENCOUNTER — Encounter (HOSPITAL_COMMUNITY): Payer: Self-pay | Admitting: Gastroenterology

## 2017-01-10 ENCOUNTER — Other Ambulatory Visit: Payer: Self-pay

## 2017-01-10 ENCOUNTER — Encounter (HOSPITAL_COMMUNITY)
Admission: RE | Disposition: A | Discharge status: Home / Self Care | Payer: Self-pay | Source: Ambulatory Visit | Attending: Gastroenterology

## 2017-01-10 ENCOUNTER — Telehealth: Payer: Self-pay | Admitting: Gastroenterology

## 2017-01-10 ENCOUNTER — Ambulatory Visit (HOSPITAL_COMMUNITY)
Admission: RE | Admit: 2017-01-10 | Discharge: 2017-01-10 | Disposition: A | Discharge status: Home / Self Care | Payer: BLUE CROSS/BLUE SHIELD | Source: Ambulatory Visit | Attending: Gastroenterology | Admitting: Gastroenterology

## 2017-01-10 DIAGNOSIS — R1310 Dysphagia, unspecified: Secondary | ICD-10-CM | POA: Diagnosis present

## 2017-01-10 HISTORY — PX: ESOPHAGEAL MANOMETRY: SHX5429

## 2017-01-10 SURGERY — MANOMETRY, ESOPHAGUS

## 2017-01-10 MED ORDER — LIDOCAINE VISCOUS 2 % MT SOLN
OROMUCOSAL | Status: AC
  Filled 2017-01-10: qty 15

## 2017-01-10 SURGICAL SUPPLY — 2 items
FACESHIELD LNG OPTICON STERILE (SAFETY) IMPLANT
GLOVE BIO SURGEON STRL SZ8 (GLOVE) ×6 IMPLANT

## 2017-01-10 NOTE — Telephone Encounter (Signed)
Letter faxed as requested

## 2017-01-10 NOTE — Progress Notes (Signed)
Attempted to place esophageal manometry probe x3. Advance catheter as far as the back of the throat and patient pulled probe out, kept grabbing RN"s arm and wanted the probe out after last attempt to the back of the throat. Never passed to the UES. No real coughing . Pt stated he just couldn't do it. Will let Dr. Myrtie Neitheranis know.

## 2017-01-10 NOTE — H&P (Signed)
History:  This patient presents for endoscopic testing for dysphagia.  Jared Parsons Referring physician: Josue HectorNYLAND,LEONARD ROBERT, MD  Past Medical History: Past Medical History:  Diagnosis Date  . Pneumonia      Past Surgical History: Past Surgical History:  Procedure Laterality Date  . MYRINGOTOMY    . SEPTOPLASTY      Allergies: No Known Allergies  Outpatient Meds: No current facility-administered medications for this encounter.       ___________________________________________________________________ Objective   Exam:  Ht 5\' 11"  (1.803 m)    There is no physician contact with the patient for this procedure.  It is performed by the endoscopy nurse.     Assessment:  dysphagia  Plan:  Esophageal manometry   Charlie PitterHenry L Danis III

## 2017-01-22 NOTE — Progress Notes (Signed)
Endoscopy nurse was unable to perform esophageal manometry due to patient's lack of cooperation.  No further action taken.

## 2017-09-05 ENCOUNTER — Ambulatory Visit (INDEPENDENT_AMBULATORY_CARE_PROVIDER_SITE_OTHER): Payer: BLUE CROSS/BLUE SHIELD | Admitting: Gastroenterology

## 2017-09-05 ENCOUNTER — Encounter: Payer: Self-pay | Admitting: Gastroenterology

## 2017-09-05 ENCOUNTER — Encounter (INDEPENDENT_AMBULATORY_CARE_PROVIDER_SITE_OTHER): Payer: Self-pay

## 2017-09-05 VITALS — BP 130/90 | HR 80 | Ht 71.0 in | Wt 183.6 lb

## 2017-09-05 DIAGNOSIS — K219 Gastro-esophageal reflux disease without esophagitis: Secondary | ICD-10-CM | POA: Diagnosis not present

## 2017-09-05 DIAGNOSIS — R1310 Dysphagia, unspecified: Secondary | ICD-10-CM

## 2017-09-05 DIAGNOSIS — R140 Abdominal distension (gaseous): Secondary | ICD-10-CM | POA: Diagnosis not present

## 2017-09-05 DIAGNOSIS — R1319 Other dysphagia: Secondary | ICD-10-CM

## 2017-09-05 NOTE — Patient Instructions (Addendum)
Stop the antibiotics  Use the over the counter omeprazole with evening meal.  Maalox as needed for overnight breakthrough heartburn.  Follow up as needed with Dr. Myrtie Neitheranis.   Food Choices for Gastroesophageal Reflux Disease, Adult When you have gastroesophageal reflux disease (GERD), the foods you eat and your eating habits are very important. Choosing the right foods can help ease your discomfort. What guidelines do I need to follow?  Choose fruits, vegetables, whole grains, and low-fat dairy products.  Choose low-fat meat, fish, and poultry.  Limit fats such as oils, salad dressings, butter, nuts, and avocado.  Keep a food diary. This helps you identify foods that cause symptoms.  Avoid foods that cause symptoms. These may be different for everyone.  Eat small meals often instead of 3 large meals a day.  Eat your meals slowly, in a place where you are relaxed.  Limit fried foods.  Cook foods using methods other than frying.  Avoid drinking alcohol.  Avoid drinking large amounts of liquids with your meals.  Avoid bending over or lying down until 2-3 hours after eating. What foods are not recommended? These are some foods and drinks that may make your symptoms worse: Vegetables Tomatoes. Tomato juice. Tomato and spaghetti sauce. Chili peppers. Onion and garlic. Horseradish. Fruits Oranges, grapefruit, and lemon (fruit and juice). Meats High-fat meats, fish, and poultry. This includes hot dogs, ribs, ham, sausage, salami, and bacon. Dairy Whole milk and chocolate milk. Sour cream. Cream. Butter. Ice cream. Cream cheese. Drinks Coffee and tea. Bubbly (carbonated) drinks or energy drinks. Condiments Hot sauce. Barbecue sauce. Sweets/Desserts Chocolate and cocoa. Donuts. Peppermint and spearmint. Fats and Oils High-fat foods. This includes JamaicaFrench fries and potato chips. Other Vinegar. Strong spices. This includes black pepper, white pepper, red pepper, cayenne, curry  powder, cloves, ginger, and chili powder. The items listed above may not be a complete list of foods and drinks to avoid. Contact your dietitian for more information. This information is not intended to replace advice given to you by your health care provider. Make sure you discuss any questions you have with your health care provider. Document Released: 04/30/2012 Document Revised: 04/06/2016 Document Reviewed: 09/03/2013 Elsevier Interactive Patient Education  2017 [employer]

## 2017-09-05 NOTE — Progress Notes (Signed)
[name] Progress Note  Chief Complaint: GERD and dysphagia  Subjective  History:  This is a 41 year old man I saw earlier this year for GERD and dysphagia. He had feelings of a fullness and things getting stuck in his upper chest. Upper endoscopy was normal, and I scheduled esophageal manometry. He was unable to tolerate passage of the probe, so the procedure was not performed. He reports having relief of symptoms for several months, and then had recurrence of GERD sour brash with associated overnight or early morning chest discomfort and feelings of tightness or fullness in the upper chest. He has not had any food gets stuck such that he would have to bring it back up. He has intermittent bloating, no nausea vomiting or weight loss. He describes the symptoms to primary care, and was tested for H. pylori. IgG antibody was negative, IgA antibody was positive, and based on this he was treated with metronidazole (which he has finished) and tetracycline(which he has not).  He was given samples of Dexilant and now seems to get the most relief from OTC omeprazole which he takes in the morning.  ROS: Cardiovascular:  no chest pain Respiratory: no dyspnea A few years of intermittent loose nonbloody stool Remainder of systems negative Septra as above The patient's Past Medical, Family and Social History were reviewed and are on file in the EMR.  Objective:  Med list reviewed  Current Outpatient Prescriptions:  .  omeprazole (PRILOSEC) 20 MG capsule, Take 1 capsule by mouth daily., Disp: , Rfl:  .  tetracycline (ACHROMYCIN,SUMYCIN) 500 MG capsule, Take 1 capsule by mouth 4 (four) times daily., Disp: , Rfl: 2 .  bismuth subsalicylate (PEPTO BISMOL) 262 MG chewable tablet, Chew 524 mg by mouth as needed., Disp: , Rfl:  .  dexlansoprazole (DEXILANT) 60 MG capsule, Take 1 capsule by mouth daily., Disp: , Rfl:   Current Facility-Administered Medications:  .  0.9 %  sodium chloride  infusion, 500 mL, Intravenous, Continuous, Danis, Starr Lake III, MD   Vital signs in last 24 hrs: Vitals:   09/05/17 0828  BP: 130/90  Pulse: 80    Physical Exam    HEENT: sclera anicteric, oral mucosa moist without lesions  Neck: supple, no thyromegaly, JVD or lymphadenopathy  Cardiac: RRR without murmurs, S1S2 heard, no peripheral edema  Pulm: clear to auscultation bilaterally, normal RR and effort noted  Abdomen: soft, No tenderness, with active bowel sounds. No guarding or palpable hepatosplenomegaly.  Skin; warm and dry, no jaundice or rash  Recent Labs:  H pylori IgG neg (IgA pos) - done with PCP   @ASSESSMENTPLANBEGIN @ Assessment: Encounter Diagnoses  Name Primary?  . Gastroesophageal reflux disease without esophagitis Yes  . Esophageal dysphagia   . Abdominal bloating     Future has GERD that bothersome primarily at night, and I think this chest tightness is a mild dysmotility related to that with an anxiety overlay. I do not think he had true H. pylori infection, and he does not need to finish the tetracycline. We discussed GERD and especially how medicines may give incomplete relief. Diet and lifestyle measures are equally important, and written materials were given to him about this today.  He will continue OTC omeprazole, but reduce it to supper time. I do not feel the need to make another attempt at esophageal manometry given the symptom pattern over the last 9 months. See me as needed. Total time 25 minutes, over half spent in counseling and coordination of  care.   Jared Parsons   CC: Jared Parsons

## 2017-09-05 NOTE — Progress Notes (Signed)
Symptoms have returned.

## 2020-10-22 ENCOUNTER — Encounter (INDEPENDENT_AMBULATORY_CARE_PROVIDER_SITE_OTHER): Payer: Self-pay | Admitting: Otolaryngology

## 2020-10-22 ENCOUNTER — Ambulatory Visit (INDEPENDENT_AMBULATORY_CARE_PROVIDER_SITE_OTHER): Payer: PRIVATE HEALTH INSURANCE | Admitting: Otolaryngology

## 2020-10-22 ENCOUNTER — Other Ambulatory Visit: Payer: Self-pay

## 2020-10-22 VITALS — Temp 98.1°F

## 2020-10-22 DIAGNOSIS — H6982 Other specified disorders of Eustachian tube, left ear: Secondary | ICD-10-CM | POA: Diagnosis not present

## 2020-10-22 DIAGNOSIS — H60312 Diffuse otitis externa, left ear: Secondary | ICD-10-CM

## 2020-10-22 DIAGNOSIS — H6122 Impacted cerumen, left ear: Secondary | ICD-10-CM | POA: Diagnosis not present

## 2020-10-22 NOTE — Progress Notes (Signed)
HPI: Jared Parsons is a 44 y.o. male who presents for evaluation of left ear complaints.  He apparently developed some loss of hearing in the left ear like being underwater.  He has also had a drainage from the left ear.  He has had distorted hearing on the left although it is doing better now since taking eardrops.  He saw another ENT doctor that felt like he might have a cholesteatoma and recommended the patient to come back after the holidays.  He has been using Cipro Floxin eardrops.  His hearing is doing better presently but he still has some ringing in the left ear.  This has been going on for the past couple of months. Of note I performed a septoplasty and turbinate reductions on him 12 years ago and he has been doing better since that surgery.  Past Medical History:  Diagnosis Date  . Pneumonia    Past Surgical History:  Procedure Laterality Date  . ESOPHAGEAL MANOMETRY N/A 01/10/2017   Procedure: ESOPHAGEAL MANOMETRY (EM);  Surgeon: Sherrilyn Rist, MD;  Location: [facility];  Service: Gastroenterology;  Laterality: N/A;  . MYRINGOTOMY    . SEPTOPLASTY     Social History   Socioeconomic History  . Marital status: Single    Spouse name: Not on file  . Number of children: Not on file  . Years of education: Not on file  . Highest education level: Not on file  Occupational History  . Occupation: maintence  Tobacco Use  . Smoking status: Never Smoker  . Smokeless tobacco: Never Used  Substance and Sexual Activity  . Alcohol use: No  . Drug use: Not on file  . Sexual activity: Not on file  Other Topics Concern  . Not on file  Social History Narrative  . Not on file   Social Determinants of Health   Financial Resource Strain: Not on file  Food Insecurity: Not on file  Transportation Needs: Not on file  Physical Activity: Not on file  Stress: Not on file  Social Connections: Not on file   Family History  Problem Relation Age of Onset  . Prostate cancer Maternal  Grandfather   . Colon polyps Father   . Esophageal cancer Neg Hx   . Colon cancer Neg Hx    No Known Allergies Prior to Admission medications   Medication Sig Start Date End Date Taking? Authorizing Provider  bismuth subsalicylate (PEPTO BISMOL) 262 MG chewable tablet Chew 524 mg by mouth as needed.    [provider]  dexlansoprazole (DEXILANT) 60 MG capsule Take 1 capsule by mouth daily. 08/10/17   [provider]  omeprazole (PRILOSEC) 20 MG capsule Take 1 capsule by mouth daily.    [provider]  tetracycline (ACHROMYCIN,SUMYCIN) 500 MG capsule Take 1 capsule by mouth 4 (four) times daily. 08/17/17   [provider]     Positive ROS: Otherwise negative  All other systems have been reviewed and were otherwise negative with the exception of those mentioned in the HPI and as above.  Physical Exam: Constitutional: Alert, well-appearing, no acute distress Ears: External ears without lesions or tenderness.  Right ear canal right TM are clear.  Left ear canal reveals drainage and diffuse external otitis.  This was copiously cleaned with irrigation with hydroperoxide and suction.  After cleaning the ear canal the TM appeared intact but was slightly retracted.  I did not identify any evidence of cholesteatoma.  After cleaning the ear canal I applied  gentian violet, Ciprodex and CSF powder to the left ear canal.  Hearing screening with a tuning forks revealed reasonably symmetric hearing perhaps slightly better on the right side. Nasal: External nose without lesions. Septum midline.. Clear nasal passages bilaterally although he does have mild rhinitis.  He did have difficulty equalizing pressure in the left ear. Oral: Lips and gums without lesions. Tongue and palate mucosa without lesions. Posterior oropharynx clear. Neck: No palpable adenopathy or masses Respiratory: Breathing comfortably  Skin: No facial/neck lesions or rash noted.  Cerumen impaction  removal  Date/Time: 10/22/2020 6:30 PM Performed by: Drema Halon, MD Authorized by: Drema Halon, MD   Consent:    Consent obtained:  Verbal   Consent given by:  Patient   Risks discussed:  Pain and bleeding Procedure details:    Location:  L ear and R ear   Procedure type: irrigation and suction   Post-procedure details:    Inspection:  TM intact and canal normal   Hearing quality:  Improved   Patient tolerance of procedure:  Tolerated well, no immediate complications Comments:     Patient with a large amount of debris within the left ear canal that was cleaned with suction and irrigation with hydroperoxide.  Findings more consistent with external otitis. No gross evidence of cholesteatoma on exam today.  I applied gentian violet, Ciprodex and CSF powder to the left ear only.    Assessment: Left external otitis Has been told previously that he might have a cholesteatoma.  Plan: Recommend keeping the left ear dry and he will stop the eardrops. He will follow-up in 10 days for recheck.  Recommended obtaining an audiogram in the meantime and bring this with him at next visit. Also prescribed Nasacort 2 sprays each nostril at night to help improve eustachian tube function.  Narda Bonds, MD

## 2020-11-02 ENCOUNTER — Ambulatory Visit (INDEPENDENT_AMBULATORY_CARE_PROVIDER_SITE_OTHER): Payer: PRIVATE HEALTH INSURANCE | Admitting: Otolaryngology

## 2020-11-02 ENCOUNTER — Other Ambulatory Visit: Payer: Self-pay

## 2020-11-02 VITALS — Temp 96.4°F

## 2020-11-02 DIAGNOSIS — H60312 Diffuse otitis externa, left ear: Secondary | ICD-10-CM | POA: Diagnosis not present

## 2020-11-02 NOTE — Progress Notes (Signed)
HPI: Jared Parsons is a 44 y.o. male who returns today for evaluation of left external otitis that was treated with gentian violet, Ciprodex and CSF powder.  He was also noted to have some hearing problems and recommended obtaining audiologic testing but never had a hearing test performed as his hearing is doing much better..  Past Medical History:  Diagnosis Date  . Pneumonia    Past Surgical History:  Procedure Laterality Date  . ESOPHAGEAL MANOMETRY N/A 01/10/2017   Procedure: ESOPHAGEAL MANOMETRY (EM);  Surgeon: Sherrilyn Rist, MD;  Location: [facility];  Service: Gastroenterology;  Laterality: N/A;  . MYRINGOTOMY    . SEPTOPLASTY     Social History   Socioeconomic History  . Marital status: Single    Spouse name: Not on file  . Number of children: Not on file  . Years of education: Not on file  . Highest education level: Not on file  Occupational History  . Occupation: maintence  Tobacco Use  . Smoking status: Never Smoker  . Smokeless tobacco: Never Used  Substance and Sexual Activity  . Alcohol use: No  . Drug use: Not on file  . Sexual activity: Not on file  Other Topics Concern  . Not on file  Social History Narrative  . Not on file   Social Determinants of Health   Financial Resource Strain: Not on file  Food Insecurity: Not on file  Transportation Needs: Not on file  Physical Activity: Not on file  Stress: Not on file  Social Connections: Not on file   Family History  Problem Relation Age of Onset  . Prostate cancer Maternal Grandfather   . Colon polyps Father   . Esophageal cancer Neg Hx   . Colon cancer Neg Hx    No Known Allergies Prior to Admission medications   Medication Sig Start Date End Date Taking? Authorizing Provider  bismuth subsalicylate (PEPTO BISMOL) 262 MG chewable tablet Chew 524 mg by mouth as needed.    [provider]  dexlansoprazole (DEXILANT) 60 MG capsule Take 1 capsule by mouth daily. 08/10/17   [provider]  omeprazole (PRILOSEC) 20 MG capsule Take 1 capsule by mouth daily.    [provider]  tetracycline (ACHROMYCIN,SUMYCIN) 500 MG capsule Take 1 capsule by mouth 4 (four) times daily. 08/17/17   [provider]     Positive ROS: Otherwise negative  All other systems have been reviewed and were otherwise negative with the exception of those mentioned in the HPI and as above.  Physical Exam: Constitutional: Alert, well-appearing, no acute distress Ears: External ears without lesions or tenderness.  Left ear canal is clear today he still has some gentian violet present but no evidence of fungal external otitis TM is clear with good mobility on pneumatic otoscopy.  On hearing screening with the 1024 tuning fork hears about the same in both ears with good hearing in both ears. Nasal: External nose without lesions.. Clear nasal passages Oral: Lips and gums without lesions. Tongue and palate mucosa without lesions. Posterior oropharynx clear. Neck: No palpable adenopathy or masses Respiratory: Breathing comfortably  Skin: No facial/neck lesions or rash noted.  Procedures  Assessment: Resolved left external otitis  Plan: He will follow-up as needed   Narda Bonds, MD

## 2021-01-21 ENCOUNTER — Other Ambulatory Visit: Payer: Self-pay

## 2021-01-21 ENCOUNTER — Ambulatory Visit (INDEPENDENT_AMBULATORY_CARE_PROVIDER_SITE_OTHER): Payer: PRIVATE HEALTH INSURANCE | Admitting: Otolaryngology

## 2021-01-21 VITALS — Temp 97.7°F

## 2021-01-21 DIAGNOSIS — H6982 Other specified disorders of Eustachian tube, left ear: Secondary | ICD-10-CM

## 2021-01-21 NOTE — Progress Notes (Signed)
HPI: Jared Parsons is a 45 y.o. male who returns today for evaluation of muffled hearing that comes and goes in the left ear.  He has occasional soreness down in the ear but no drainage.  The ear is doing better today.  He has not noticed a lot of hearing problems.  I had seen him previously back in December with a left fungal external otitis. Of note he had tubes as a child.  Past Medical History:  Diagnosis Date  . Pneumonia    Past Surgical History:  Procedure Laterality Date  . ESOPHAGEAL MANOMETRY N/A 01/10/2017   Procedure: ESOPHAGEAL MANOMETRY (EM);  Surgeon: Sherrilyn Rist, MD;  Location: [facility];  Service: Gastroenterology;  Laterality: N/A;  . MYRINGOTOMY    . SEPTOPLASTY     Social History   Socioeconomic History  . Marital status: Single    Spouse name: Not on file  . Number of children: Not on file  . Years of education: Not on file  . Highest education level: Not on file  Occupational History  . Occupation: maintence  Tobacco Use  . Smoking status: Never Smoker  . Smokeless tobacco: Never Used  Substance and Sexual Activity  . Alcohol use: No  . Drug use: Not on file  . Sexual activity: Not on file  Other Topics Concern  . Not on file  Social History Narrative  . Not on file   Social Determinants of Health   Financial Resource Strain: Not on file  Food Insecurity: Not on file  Transportation Needs: Not on file  Physical Activity: Not on file  Stress: Not on file  Social Connections: Not on file   Family History  Problem Relation Age of Onset  . Prostate cancer Maternal Grandfather   . Colon polyps Father   . Esophageal cancer Neg Hx   . Colon cancer Neg Hx    No Known Allergies Prior to Admission medications   Medication Sig Start Date End Date Taking? Authorizing Provider  bismuth subsalicylate (PEPTO BISMOL) 262 MG chewable tablet Chew 524 mg by mouth as needed.    [provider]  dexlansoprazole (DEXILANT) 60 MG capsule Take  1 capsule by mouth daily. 08/10/17   [provider]  omeprazole (PRILOSEC) 20 MG capsule Take 1 capsule by mouth daily.    [provider]  tetracycline (ACHROMYCIN,SUMYCIN) 500 MG capsule Take 1 capsule by mouth 4 (four) times daily. 08/17/17   [provider]     Positive ROS: Otherwise negative  All other systems have been reviewed and were otherwise negative with the exception of those mentioned in the HPI and as above.  Physical Exam: Constitutional: Alert, well-appearing, no acute distress Ears: External ears without lesions or tenderness. Ear canals are clear bilaterally with no signs of external otitis.  The left TM is slightly retracted but no obvious middle ear fluid noted.  No other TM abnormality noted.  On hearing screening with the tuning forks he heard about the same in both ears perhaps a little bit better in the right than the left.  Weber actually went to the left side however AC was greater than BC on both sides.  And subjectively he heard well in both ears with a 1024 tuning fork. Nasal: External nose without lesions. Septum with mild deformity.. Clear nasal passages otherwise.  Nasopharyngoscopy was performed to evaluate nasopharynx and eustachian tube openings as he was difficult to equalize pressure in the left ear with insufflation through the  nose.  On nasopharyngoscopy both eustachian tube areas were clear. Oral: Lips and gums without lesions. Tongue and palate mucosa without lesions. Posterior oropharynx clear. Neck: No palpable adenopathy or masses Respiratory: Breathing comfortably  Skin: No facial/neck lesions or rash noted.  Procedures  Assessment: Left eustachian tube dysfunction.  Plan: Recommended trying to "pop" the left ear every other day.  I also prescribed Flonase 2 sprays each nostril at night for the next couple months. We will plan on scheduling audiologic testing and have her follow-up in 2 months for recheck.  Depending  on results of the audiologic testing may benefit from having a tube placed.   Narda Bonds, MD

## 2021-04-05 ENCOUNTER — Other Ambulatory Visit: Payer: Self-pay

## 2021-04-05 ENCOUNTER — Ambulatory Visit (INDEPENDENT_AMBULATORY_CARE_PROVIDER_SITE_OTHER): Payer: PRIVATE HEALTH INSURANCE | Admitting: Otolaryngology

## 2021-04-05 DIAGNOSIS — H6982 Other specified disorders of Eustachian tube, left ear: Secondary | ICD-10-CM

## 2021-04-05 DIAGNOSIS — J310 Chronic rhinitis: Secondary | ICD-10-CM

## 2021-04-05 NOTE — Progress Notes (Signed)
HPI: Jared Parsons is a 45 y.o. male who returns today for evaluation of ear symptoms with intermittent ear pressure.  He occasionally has ringing more in the left ear.  He has purchased an ear popper or E[employer] from [employer] which helps pops his ears.  He still gets occasional popping in the ear when he swallows or eats.  He is also has some slight fullness or pressure in the ear and has used Cipro ophthalmic 0.3% drops in the ears that seems to help.  He presents today to have his ears checked. Of note he had an audiogram performed at [facility] that showed normal hearing in both ears at approximately 10 dB through 4000 frequency and then a drop in hearing in the left only at 6000 frequency down to 30 dB.  Past Medical History:  Diagnosis Date  . Pneumonia    Past Surgical History:  Procedure Laterality Date  . ESOPHAGEAL MANOMETRY N/A 01/10/2017   Procedure: ESOPHAGEAL MANOMETRY (EM);  Surgeon: Sherrilyn Rist, MD;  Location: [facility];  Service: Gastroenterology;  Laterality: N/A;  . MYRINGOTOMY    . SEPTOPLASTY     Social History   Socioeconomic History  . Marital status: Single    Spouse name: Not on file  . Number of children: Not on file  . Years of education: Not on file  . Highest education level: Not on file  Occupational History  . Occupation: maintence  Tobacco Use  . Smoking status: Never Smoker  . Smokeless tobacco: Never Used  Substance and Sexual Activity  . Alcohol use: No  . Drug use: Not on file  . Sexual activity: Not on file  Other Topics Concern  . Not on file  Social History Narrative  . Not on file   Social Determinants of Health   Financial Resource Strain: Not on file  Food Insecurity: Not on file  Transportation Needs: Not on file  Physical Activity: Not on file  Stress: Not on file  Social Connections: Not on file   Family History  Problem Relation Age of Onset  . Prostate cancer Maternal Grandfather   . Colon polyps Father   .  Esophageal cancer Neg Hx   . Colon cancer Neg Hx    No Known Allergies Prior to Admission medications   Medication Sig Start Date End Date Taking? Authorizing Provider  bismuth subsalicylate (PEPTO BISMOL) 262 MG chewable tablet Chew 524 mg by mouth as needed.    [provider]  dexlansoprazole (DEXILANT) 60 MG capsule Take 1 capsule by mouth daily. 08/10/17   [provider]  omeprazole (PRILOSEC) 20 MG capsule Take 1 capsule by mouth daily.    [provider]  tetracycline (ACHROMYCIN,SUMYCIN) 500 MG capsule Take 1 capsule by mouth 4 (four) times daily. 08/17/17   [provider]     Positive ROS: Otherwise negative  All other systems have been reviewed and were otherwise negative with the exception of those mentioned in the HPI and as above.  Physical Exam: Constitutional: Alert, well-appearing, no acute distress Ears: External ears without lesions or tenderness. Ear canals are clear bilaterally with intact, clear TMs bilaterally. Nasal: External nose without lesions. Septum with mild deviation and mild rhinitis.. Clear nasal passages otherwise. Oral: Lips and gums without lesions. Tongue and palate mucosa without lesions. Posterior oropharynx clear. Neck: No palpable adenopathy or masses Respiratory: Breathing comfortably  Skin: No facial/neck lesions or rash noted.  Procedures  Assessment: Chronic rhinitis with eustachian tube  dysfunction.  Plan: Agree with the treatment that he is presently doing as he is doing better today. Recommended use of nasal steroid spray on a regular basis which he has not been using and he has refills for this but has not used the refill yet. I also refilled his Cipro ophthalmic 0.3% solution to use as needed. He will follow-up if he notices any significant problems.   Narda Bonds, MD

## 2021-04-18 ENCOUNTER — Ambulatory Visit (INDEPENDENT_AMBULATORY_CARE_PROVIDER_SITE_OTHER): Payer: PRIVATE HEALTH INSURANCE | Admitting: Otolaryngology

## 2021-09-05 ENCOUNTER — Encounter (INDEPENDENT_AMBULATORY_CARE_PROVIDER_SITE_OTHER): Payer: Self-pay

## 2021-10-25 ENCOUNTER — Emergency Department (HOSPITAL_COMMUNITY)
Admission: EM | Admit: 2021-10-25 | Discharge: 2021-10-25 | Disposition: A | Discharge status: Home / Self Care | Payer: PRIVATE HEALTH INSURANCE | Attending: Emergency Medicine | Admitting: Emergency Medicine

## 2021-10-25 ENCOUNTER — Encounter (HOSPITAL_COMMUNITY): Payer: Self-pay | Admitting: Emergency Medicine

## 2021-10-25 ENCOUNTER — Other Ambulatory Visit: Payer: Self-pay

## 2021-10-25 DIAGNOSIS — Z79899 Other long term (current) drug therapy: Secondary | ICD-10-CM | POA: Insufficient documentation

## 2021-10-25 DIAGNOSIS — F419 Anxiety disorder, unspecified: Secondary | ICD-10-CM | POA: Insufficient documentation

## 2021-10-25 HISTORY — DX: Anxiety disorder, unspecified: F41.9

## 2021-10-25 LAB — BASIC METABOLIC PANEL
Anion gap: 12 (ref 5–15)
BUN: 13 mg/dL (ref 6–20)
CO2: 21 mmol/L — ABNORMAL LOW (ref 22–32)
Calcium: 9.6 mg/dL (ref 8.9–10.3)
Chloride: 101 mmol/L (ref 98–111)
Creatinine, Ser: 0.95 mg/dL (ref 0.61–1.24)
GFR, Estimated: >60 mL/min (ref >60)
Glucose, Bld: 129 mg/dL — ABNORMAL HIGH (ref 70–99)
Potassium: 3.4 mmol/L — ABNORMAL LOW (ref 3.5–5.1)
Sodium: 134 mmol/L — ABNORMAL LOW (ref 135–145)

## 2021-10-25 LAB — CBC
HCT: 46.5 % (ref 39.0–52.0)
Hemoglobin: 16.6 g/dL (ref 13.0–17.0)
MCH: 31.6 pg (ref 26.0–34.0)
MCHC: 35.7 g/dL (ref 30.0–36.0)
MCV: 88.6 fL (ref 80.0–100.0)
Platelets: 238 10*3/uL (ref 150–400)
RBC: 5.25 MIL/uL (ref 4.22–5.81)
RDW: 12.2 % (ref 11.5–15.5)
WBC: 7.1 10*3/uL (ref 4.0–10.5)
nRBC: 0 % (ref 0.0–0.2)

## 2021-10-25 LAB — RAPID URINE DRUG SCREEN, HOSP PERFORMED
Amphetamines: NOT DETECTED
Barbiturates: NOT DETECTED
Benzodiazepines: NOT DETECTED
Cocaine: NOT DETECTED
Opiates: NOT DETECTED
Tetrahydrocannabinol: NOT DETECTED

## 2021-10-25 LAB — TROPONIN I (HIGH SENSITIVITY): Troponin I (High Sensitivity): 3 ng/L (ref <18)

## 2021-10-25 MED ORDER — SODIUM CHLORIDE 0.9 % IV BOLUS
500.0000 mL | Freq: Once | INTRAVENOUS | Status: AC
  Administered 2021-10-25: 500 mL via INTRAVENOUS

## 2021-10-25 MED ORDER — LORAZEPAM 1 MG PO TABS: 1.0000 mg | ORAL_TABLET | Freq: Three times a day (TID) | ORAL | 0 refills | Status: DC | PRN

## 2021-10-25 MED ORDER — LORAZEPAM 2 MG/ML IJ SOLN
1.0000 mg | Freq: Once | INTRAMUSCULAR | Status: AC
  Administered 2021-10-25: 1 mg via INTRAVENOUS
  Filled 2021-10-25: qty 1

## 2021-10-25 NOTE — Discharge Instructions (Signed)
You were seen in the emergency department today for chest tightness.  As discussed I think your symptoms are likely related to anxiety.  Like you to continue taking the medication that your doctor prescribed, with the understanding that it may take several weeks for you to notice a significant difference with these.  I am prescribing you the same medicine that you received in the emergency department, called Ativan.  You can take this as needed for symptoms of panic attack like we discussed.  Continue to monitor how you're doing and return to the ER for new or worsening symptoms such as chest pain that is exertional, difficulty breathing, or any thoughts of wanting to harm herself or others.   It has been a pleasure seeing and caring for you today and I hope you start feeling better soon!

## 2021-10-25 NOTE — ED Triage Notes (Addendum)
Has been getting treated for anxiety, new RX for buspar and prozac, experiencing worsening anxiety attacks with CP and hyperventilating, numbness and tingling, feeling jittery.EMS arrived to patient on floor. With EMS, EKG NSR and RA100%, 70bpm.Unable to identify new life stressors.Endorses chest tightness at this time and "just not feeling right"

## 2021-10-25 NOTE — ED Provider Notes (Signed)
[name] EMERGENCY DEPARTMENT Provider Note   CSN: [id number] Arrival date & time: 10/25/21  1107     History Chief Complaint  Patient presents with   Anxiety    Jared Parsons is a 45 y.o. male with history of anxiety who presents to the emergency department complaining of worsening anxiety attacks with chest pain and hyperventilation.  Patient states he was recently prescribed BuSpar and Prozac.  He is unable to identify new any new life stressors.  States that he is experiencing some chest tightness, and "just not feeling right".  He states that last night he had an episode when she was in the bathroom, and felt like he was choking and needed to vomit.  He also felt lightheaded and fell to the ground, there is no head trauma or loss of consciousness.  Patient overall states in the past several weeks has been feeling more anxious, is describing feeling anxious about her recent [employer] purchase and the cost and transportation of this.  Denies suicidal, homicidal ideation, visual or auditory hallucinations.   Anxiety Associated symptoms include shortness of breath. Pertinent negatives include no abdominal pain.      Past Medical History:  Diagnosis Date   Anxiety    Pneumonia     Patient Active Problem List   Diagnosis Date Noted   ABNORMAL TRANSAMINASE-LFT'S 07/28/2008   OTHER ABNORMAL BLOOD CHEMISTRY 06/12/2008   GERD 06/10/2008   CHEST PAIN UNSPECIFIED 06/10/2008    Past Surgical History:  Procedure Laterality Date   ESOPHAGEAL MANOMETRY N/A 01/10/2017   Procedure: ESOPHAGEAL MANOMETRY (EM);  Surgeon: Doran Stabler, MD;  Location: [facility];  Service: Gastroenterology;  Laterality: N/A;   MYRINGOTOMY     SEPTOPLASTY         Family History  Problem Relation Age of Onset   Prostate cancer Maternal Grandfather    Colon polyps Father    Esophageal cancer Neg Hx    Colon cancer Neg Hx     Social History   Tobacco Use   Smoking status: Never   Smokeless  tobacco: Never  Substance Use Topics   Alcohol use: No    Home Medications Prior to Admission medications   Medication Sig Start Date End Date Taking? Authorizing Provider  busPIRone (BUSPAR) 15 MG tablet Take 15 mg by mouth 2 (two) times daily. 10/20/21  Yes [provider]  FLUoxetine (PROZAC) 20 MG capsule Take 20 mg by mouth daily. 10/20/21  Yes [provider]  LORazepam (ATIVAN) 1 MG tablet Take 1 tablet (1 mg total) by mouth 3 (three) times daily as needed for anxiety. 10/25/21  Yes Kvion Shapley T, PA-C  pantoprazole (PROTONIX) 40 MG tablet Take 40 mg by mouth daily. 10/10/21  Yes [provider]  dexlansoprazole (DEXILANT) 60 MG capsule Take 1 capsule by mouth daily. Patient not taking: Reported on 10/25/2021 08/10/17   [provider]  omeprazole (PRILOSEC) 20 MG capsule Take 1 capsule by mouth daily. Patient not taking: Reported on 10/25/2021    [provider]  tetracycline (ACHROMYCIN,SUMYCIN) 500 MG capsule Take 1 capsule by mouth 4 (four) times daily. Patient not taking: Reported on 10/25/2021 08/17/17   [provider]    Allergies    Patient has no known allergies.  Review of Systems   Review of Systems  Constitutional:  Negative for chills and fever.  Respiratory:  Positive for chest tightness and shortness of breath. Negative for cough.   Gastrointestinal:  Negative for abdominal pain,  constipation, diarrhea, nausea and vomiting.  Psychiatric/Behavioral:  The patient is nervous/anxious.   All other systems reviewed and are negative.  Physical Exam Updated Vital Signs BP 128/75    Pulse 69    Temp 98.9 F (37.2 C) (Oral)    Resp 20    Wt 79.4 kg    SpO2 96%    BMI 24.41 kg/m   Physical Exam Vitals and nursing note reviewed.  Constitutional:      Appearance: Normal appearance.  HENT:     Head: Normocephalic and atraumatic.  Eyes:     Conjunctiva/sclera: Conjunctivae normal.  Cardiovascular:     Rate  and Rhythm: Normal rate and regular rhythm.  Pulmonary:     Effort: Pulmonary effort is normal. No respiratory distress.     Breath sounds: Normal breath sounds.  Abdominal:     General: There is no distension.     Palpations: Abdomen is soft.     Tenderness: There is no abdominal tenderness.  Skin:    General: Skin is warm and dry.  Neurological:     General: No focal deficit present.     Mental Status: He is alert.  Psychiatric:        Attention and Perception: Attention and perception normal.        Mood and Affect: Affect normal. Mood is anxious.        Speech: Speech normal.        Behavior: Behavior normal.        Thought Content: Thought content normal.    ED Results / Procedures / Treatments   Labs (all labs ordered are listed, but only abnormal results are displayed) Labs Reviewed  BASIC METABOLIC PANEL - Abnormal; Notable for the following components:      Result Value   Sodium 134 (*)    Potassium 3.4 (*)    CO2 21 (*)    Glucose, Bld 129 (*)    All other components within normal limits  CBC  RAPID URINE DRUG SCREEN, HOSP PERFORMED  TROPONIN I (HIGH SENSITIVITY)    EKG None  Radiology No results found.  Procedures Procedures   Medications Ordered in ED Medications  LORazepam (ATIVAN) injection 1 mg (1 mg Intravenous Given 10/25/21 1239)  sodium chloride 0.9 % bolus 500 mL (0 mLs Intravenous Stopped 10/25/21 1346)    ED Course  I have reviewed the triage vital signs and the nursing notes.  Pertinent labs & imaging results that were available during my care of the patient were reviewed by me and considered in my medical decision making (see chart for details).    MDM Rules/Calculators/A&P                           Patient is otherwise healthy 45 year old male who presents to the emergency department with complaints of chest tightness associated with worsening anxiety in the past several weeks.  On my exam patient is afebrile, not tachycardic,  not hypoxic, no acute distress.  He continues to appear more anxious when he is talking about the things that stress him out recently.  Lung sounds are clear to auscultation all fields, and heart is regular rate and rhythm.  Patient given 1 dose of Ativan, and states this significantly improved his symptoms.  Patient is to be discharged with recommendation to follow up with PCP in regards to today's hospital visit. Chest pain is not likely of cardiac or pulmonary etiology d/t presentation, PERC  negative, VSS, no tracheal deviation, no JVD or new murmur, RRR, breath sounds equal bilaterally, EKG without acute abnormalities, negative troponin, and negative CXR. Pt has been advised to return to the ED if CP becomes exertional, associated with diaphoresis or nausea, radiates to left jaw/arm, worsens or becomes concerning in any way. Pt appears reliable for follow up and is agreeable to discharge.  Plan to give prescription for Ativan, and encouraged close follow-up with his primary doctor who has prescribed his anxiety medicine before.    Case has been discussed with and seen by Dr. Wilkie Aye who agrees with the above plan to discharge.   Final Clinical Impression(s) / ED Diagnoses Final diagnoses:  Anxiety    Rx / DC Orders ED Discharge Orders          Ordered    LORazepam (ATIVAN) 1 MG tablet  3 times daily PRN        10/25/21 1330             Nakira Litzau T, PA-C 10/25/21 1916    Rozelle [name], DO 10/27/21 [id number]

## 2021-10-25 NOTE — ED Notes (Signed)
Patient verbalizes understanding of discharge instructions. Opportunity for questioning and answers were provided. Armband removed by staff, pt discharged from ED to home via POV  

## 2021-10-28 ENCOUNTER — Emergency Department (HOSPITAL_COMMUNITY): Payer: No Typology Code available for payment source

## 2021-10-28 ENCOUNTER — Encounter (HOSPITAL_COMMUNITY): Payer: Self-pay | Admitting: *Deleted

## 2021-10-28 ENCOUNTER — Emergency Department (HOSPITAL_COMMUNITY)
Admission: EM | Admit: 2021-10-28 | Discharge: 2021-10-28 | Disposition: A | Discharge status: Home / Self Care | Payer: No Typology Code available for payment source | Attending: Emergency Medicine | Admitting: Emergency Medicine

## 2021-10-28 DIAGNOSIS — F419 Anxiety disorder, unspecified: Secondary | ICD-10-CM | POA: Insufficient documentation

## 2021-10-28 DIAGNOSIS — R0789 Other chest pain: Secondary | ICD-10-CM | POA: Insufficient documentation

## 2021-10-28 LAB — CBC WITH DIFFERENTIAL/PLATELET
Abs Immature Granulocytes: 0.01 10*3/uL (ref 0.00–0.07)
Basophils Absolute: 0.1 10*3/uL (ref 0.0–0.1)
Basophils Relative: 1 %
Eosinophils Absolute: 0.1 10*3/uL (ref 0.0–0.5)
Eosinophils Relative: 1 %
HCT: 43.9 % (ref 39.0–52.0)
Hemoglobin: 15.2 g/dL (ref 13.0–17.0)
Immature Granulocytes: 0 %
Lymphocytes Relative: 18 %
Lymphs Abs: 1.4 10*3/uL (ref 0.7–4.0)
MCH: 31.5 pg (ref 26.0–34.0)
MCHC: 34.6 g/dL (ref 30.0–36.0)
MCV: 91.1 fL (ref 80.0–100.0)
Monocytes Absolute: 0.5 10*3/uL (ref 0.1–1.0)
Monocytes Relative: 7 %
Neutro Abs: 5.8 10*3/uL (ref 1.7–7.7)
Neutrophils Relative %: 73 %
Platelets: 230 10*3/uL (ref 150–400)
RBC: 4.82 MIL/uL (ref 4.22–5.81)
RDW: 12.1 % (ref 11.5–15.5)
WBC: 7.9 10*3/uL (ref 4.0–10.5)
nRBC: 0 % (ref 0.0–0.2)

## 2021-10-28 LAB — TROPONIN I (HIGH SENSITIVITY)
Troponin I (High Sensitivity): 3 ng/L (ref <18)
Troponin I (High Sensitivity): 3 ng/L (ref <18)

## 2021-10-28 LAB — BASIC METABOLIC PANEL
Anion gap: 11 (ref 5–15)
BUN: 13 mg/dL (ref 6–20)
CO2: 24 mmol/L (ref 22–32)
Calcium: 8.8 mg/dL — ABNORMAL LOW (ref 8.9–10.3)
Chloride: 102 mmol/L (ref 98–111)
Creatinine, Ser: 0.95 mg/dL (ref 0.61–1.24)
GFR, Estimated: >60 mL/min (ref >60)
Glucose, Bld: 90 mg/dL (ref 70–99)
Potassium: 3.6 mmol/L (ref 3.5–5.1)
Sodium: 137 mmol/L (ref 135–145)

## 2021-10-28 LAB — TSH: TSH: 1.523 u[IU]/mL (ref 0.350–4.500)

## 2021-10-28 MED ORDER — LORAZEPAM 1 MG PO TABS: 1.0000 mg | ORAL_TABLET | Freq: Once | ORAL | 0 refills | Status: AC | PRN

## 2021-10-28 MED ORDER — LORAZEPAM 1 MG PO TABS
1.0000 mg | ORAL_TABLET | Freq: Once | ORAL | Status: AC
  Administered 2021-10-28: 1 mg via ORAL
  Filled 2021-10-28: qty 1

## 2021-10-28 NOTE — ED Triage Notes (Signed)
Short of breath while raking leaves today, patient has multiple complaints.

## 2021-10-28 NOTE — ED Triage Notes (Signed)
Increasing episodes of shortness of breath. States he was raking leaves and nearly passed out. Given an ativan rx for 5 pills and has been able to use them x 3 with relief

## 2021-10-28 NOTE — ED Provider Notes (Signed)
[facility] EMERGENCY DEPARTMENT Provider Note   CSN: [id number] Arrival date & time: 10/28/21  1509     History Chief Complaint  Patient presents with   panic attacks    Jared Parsons is a 45 y.o. male.  HPI  Patient with medical history including anxiety presents with complaint of anxiety attack.  Patient dates today while he was raking the grass, he started to feel anxious, had a tightness in his chest and had to go inside to lay down where he drinks some soda and started to feel better.  Patient states his chest became tight did not actually have chest pain, no pleuritic chest pain, no shortness of breath, did not become diaphoretic, no nausea or vomiting, lightheaded or dizziness.  Patient states this went away on its own, he has no cardiac history, no history of PEs or DVTs currently not on hormone therapy, denies illicit drug use, does not smoke, he denies significant amount of caffeine use and actually is cut out all caffeine starting last week.  He states that prior to where he is loose, he felt very anxious once he woke up this morning , he then took some Ativan which relieved his anxiety.  States this has been  going on for the  last 3 weeks. he was received diagnosed with anxiety, started on BuSpar as well as fluoxetine which he has been taking which has not helped him very much.  He denies any psychiatric history, he states that he has always had some anxiety but over last 3 weeks  gotten a lot worse, he denies suicidal homicidal ideations, hallucinations or delusions.  He has no other complaints.  Denies alleviating or aggravating factors.  Past Medical History:  Diagnosis Date   Anxiety    Pneumonia     Patient Active Problem List   Diagnosis Date Noted   ABNORMAL TRANSAMINASE-LFT'S 07/28/2008   OTHER ABNORMAL BLOOD CHEMISTRY 06/12/2008   GERD 06/10/2008   CHEST PAIN UNSPECIFIED 06/10/2008    Past Surgical History:  Procedure Laterality Date   ESOPHAGEAL  MANOMETRY N/A 01/10/2017   Procedure: ESOPHAGEAL MANOMETRY (EM);  Surgeon: Sherrilyn Rist, MD;  Location: [facility];  Service: Gastroenterology;  Laterality: N/A;   MYRINGOTOMY     SEPTOPLASTY         Family History  Problem Relation Age of Onset   Prostate cancer Maternal Grandfather    Colon polyps Father    Esophageal cancer Neg Hx    Colon cancer Neg Hx     Social History   Tobacco Use   Smoking status: Never   Smokeless tobacco: Never  Substance Use Topics   Alcohol use: No    Home Medications Prior to Admission medications   Medication Sig Start Date End Date Taking? Authorizing Provider  LORazepam (ATIVAN) 1 MG tablet Take 1 tablet (1 mg total) by mouth once as needed for up to 7 days for anxiety. 10/28/21 11/04/21 Yes Carroll Sage, PA-C  busPIRone (BUSPAR) 15 MG tablet Take 15 mg by mouth 2 (two) times daily. 10/20/21   [provider]  dexlansoprazole (DEXILANT) 60 MG capsule Take 1 capsule by mouth daily. Patient not taking: Reported on 10/25/2021 08/10/17   [provider]  FLUoxetine (PROZAC) 20 MG capsule Take 20 mg by mouth daily. 10/20/21   [provider]  LORazepam (ATIVAN) 1 MG tablet Take 1 tablet (1 mg total) by mouth 3 (three) times daily as needed for anxiety. 10/25/21   Roemhildt,  Lorin T, PA-C  omeprazole (PRILOSEC) 20 MG capsule Take 1 capsule by mouth daily. Patient not taking: Reported on 10/25/2021    [provider]  pantoprazole (PROTONIX) 40 MG tablet Take 40 mg by mouth daily. 10/10/21   [provider]  tetracycline (ACHROMYCIN,SUMYCIN) 500 MG capsule Take 1 capsule by mouth 4 (four) times daily. Patient not taking: Reported on 10/25/2021 08/17/17   [provider]    Allergies    Patient has no known allergies.  Review of Systems   Review of Systems  Constitutional:  Negative for chills and fever.  HENT:  Negative for congestion.   Respiratory:  Positive for chest tightness.  Negative for shortness of breath.   Cardiovascular:  Negative for chest pain.  Gastrointestinal:  Negative for abdominal pain, diarrhea, nausea and vomiting.  Genitourinary:  Negative for enuresis.  Musculoskeletal:  Negative for back pain.  Skin:  Negative for rash.  Neurological:  Negative for dizziness.  Hematological:  Does not bruise/bleed easily.  Psychiatric/Behavioral:  Negative for self-injury, sleep disturbance and suicidal ideas. The patient is nervous/anxious.    Physical Exam Updated Vital Signs BP 133/88 (BP Location: Right Arm)    Pulse 71    Temp 98.2 F (36.8 C) (Oral)    Resp 16    SpO2 100%   Physical Exam Vitals and nursing note reviewed.  Constitutional:      General: He is not in acute distress.    Appearance: He is not ill-appearing.  HENT:     Head: Normocephalic and atraumatic.     Nose: No congestion.  Eyes:     Conjunctiva/sclera: Conjunctivae normal.  Neck:     Comments: Throat was nontender to palpation, there is no nodules or asymmetry present  Cardiovascular:     Rate and Rhythm: Normal rate and regular rhythm.     Pulses: Normal pulses.     Heart sounds: No murmur heard.   No friction rub. No gallop.  Pulmonary:     Effort: No respiratory distress.     Breath sounds: No wheezing, rhonchi or rales.  Abdominal:     Palpations: Abdomen is soft.     Tenderness: There is no abdominal tenderness. There is no right CVA tenderness or left CVA tenderness.  Musculoskeletal:     Right lower leg: No edema.     Left lower leg: No edema.  Skin:    General: Skin is warm and dry.  Neurological:     Mental Status: He is alert.  Psychiatric:        Mood and Affect: Mood normal.    ED Results / Procedures / Treatments   Labs (all labs ordered are listed, but only abnormal results are displayed) Labs Reviewed  BASIC METABOLIC PANEL - Abnormal; Notable for the following components:      Result Value   Calcium 8.8 (*)    All other components within  normal limits  CBC WITH DIFFERENTIAL/PLATELET  TSH  TROPONIN I (HIGH SENSITIVITY)  TROPONIN I (HIGH SENSITIVITY)    EKG EKG Interpretation  Date/Time:  Friday October 28 2021 15:52:12 EST Ventricular Rate:  75 PR Interval:  114 QRS Duration: 86 QT Interval:  372 QTC Calculation: 415 R Axis:   80 Text Interpretation: Normal sinus rhythm Minimal voltage criteria for LVH, may be normal variant ( Sokolow-Lyon ) Borderline ECG No significant change since last tracing Confirmed by Calvert Cantor [phone]) on 10/28/2021 4:35:14 PM  Radiology DG Chest 2 View  Result Date:  10/28/2021 CLINICAL DATA:  Shortness of breath. EXAM: CHEST - 2 VIEW COMPARISON:  Chest x-ray 04/11/2008. FINDINGS: The heart size and mediastinal contours are within normal limits. Both lungs are clear. The visualized skeletal structures are unremarkable. IMPRESSION: No active cardiopulmonary disease. Electronically Signed   By: Ronney Asters M.D.   On: 10/28/2021 16:11    Procedures Procedures   Medications Ordered in ED Medications  LORazepam (ATIVAN) tablet 1 mg (1 mg Oral Given 10/28/21 1957)    ED Course  I have reviewed the triage vital signs and the nursing notes.  Pertinent labs & imaging results that were available during my care of the patient were reviewed by me and considered in my medical decision making (see chart for details).    MDM Rules/Calculators/A&P                         Initial impression-presents with anxiety.  He is alert, no acute stress, vital signs reassuring.  Unclear etiology but likely anxiety provoked, will rule out cardiac abnormality, lecture abnormality, as well as TSH and reassess.  Work-up-CBC is unremarkable, BMP shows calcium of 8.8, TSH 1.53, for troponin was 3, second was 3, chest x-ray unremarkable, EKG sinus without signs of ischemia.  Reassessment-patient reassessed and updated on lab work and imaging, he states he feels much better, has no complaints, states his  diarrhea is completely resolved and he is ready to be discharged home.  Rule out- I have low suspicion for ACS as history is atypical, patient has no cardiac history, EKG was sinus rhythm without signs of ischemia, patient had a delta troponin.  Low suspicion for PE as patient denies pleuritic chest pain, shortness of breath, patient denies leg pain, no pedal edema noted on exam, patient was PERC negative.  Low suspicion for AAA or aortic dissection as history is atypical, patient has low risk factors.  Low suspicion for systemic infection as patient is nontoxic-appearing, vital signs reassuring, no obvious source infection noted on exam.  Low suspicion for psychiatric emergency or urgency does not endorse suicidal or homicidal ideations, he is responding Prolene, does not appear to be responding to internal stimuli.  Low suspicion for hypo or hyperthyroidism as TSH is within normal limits.   Plan-  Chest tightness since resolved-unclear etiology but I suspect this is likely secondary due to anxiety, will have him continue with antianxiety medication, will give him few more days of Ativan, will have him follow-up with the behavioral health urgent care as well as follow-up with cardiology for further evaluation.  Given strict return precautions.  Vital signs have remained stable, no indication for hospital admission.    Patient given at home care as well strict return precautions.  Patient verbalized that they understood agreed to said plan.     Final Clinical Impression(s) / ED Diagnoses Final diagnoses:  Atypical chest pain    Rx / DC Orders ED Discharge Orders          Ordered    LORazepam (ATIVAN) 1 MG tablet  Once PRN        10/28/21 2139             Marcello Fennel, PA-C 10/28/21 2141    Truddie Hidden, MD 10/28/21 [phone]

## 2021-10-28 NOTE — Discharge Instructions (Signed)
Lab work and imaging were all reassuring, please continue with the anxiety  medications that was prescribed to you, have also given you a few more days of Ativan please use once a day as needed for anxiety only.  I want you to follow-up with your primary care provider, behavioral urgent care, cardiology for further evaluation.  Given information above please call  Come back to the emergency department if you develop chest pain, shortness of breath, severe abdominal pain, uncontrolled nausea, vomiting, diarrhea.

## 2021-10-28 NOTE — ED Notes (Signed)
Pt seen 3-4 times int he last 3 weeks.  Medications given were Buspirone Hcl 15 mg BID, Fluoxetine Hcl 20 mg qd and Lorazepam 1 mg 3 times daily.  Reports having increasing episodes of SOB, anxiety, dizziness, and fatigue.  Pt does have a abnormal breathing (tick) during assessment.  Father reports he has notice (tick) for about 5 years.

## 2022-05-03 IMAGING — DX DG CHEST 2V
2 series · 2 of 2 positions shown · non-contrast
Comparison: Chest x-ray 04/11/2008.

CLINICAL DATA: Shortness of breath.

EXAM:
CHEST - 2 VIEW

[chest pa]
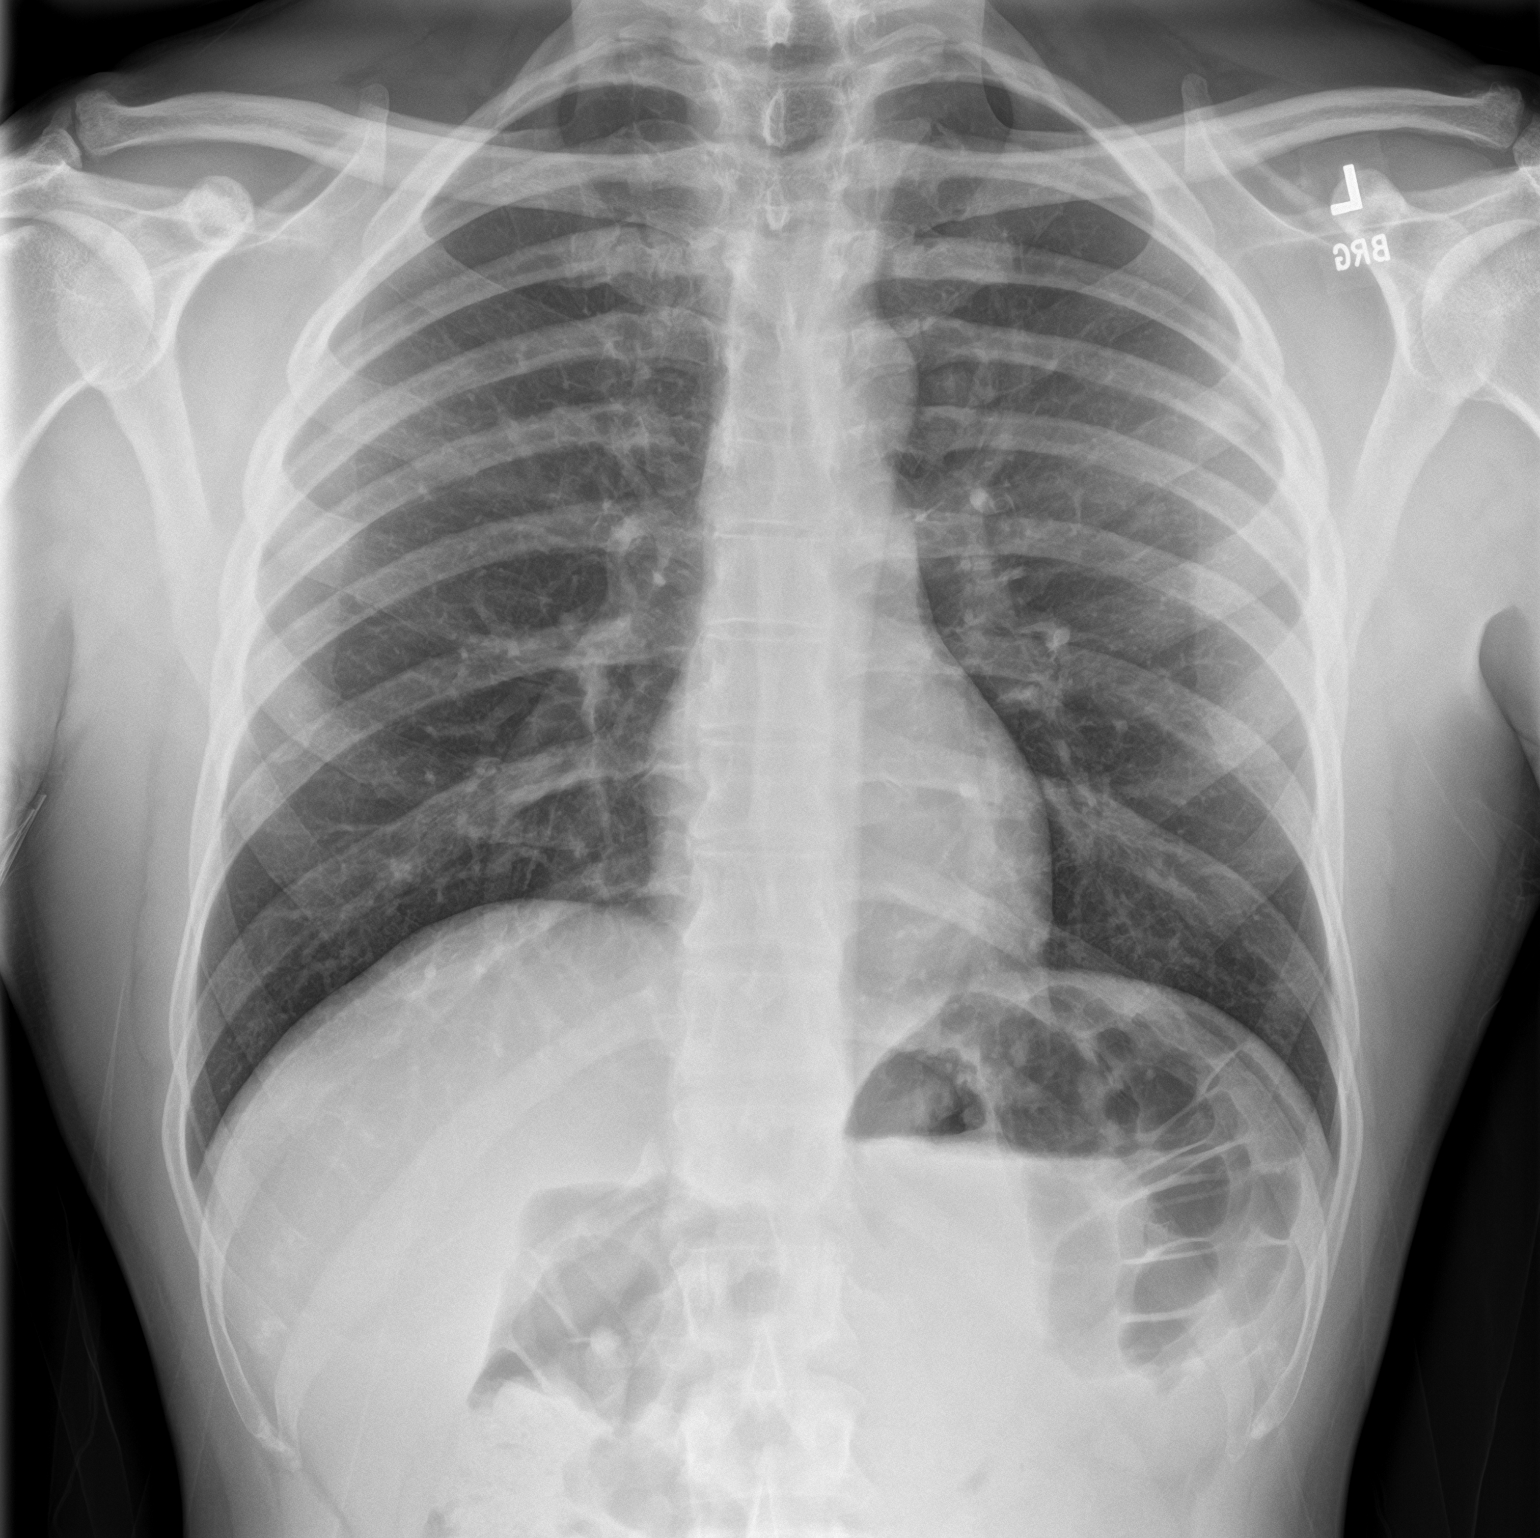

[chest lat]
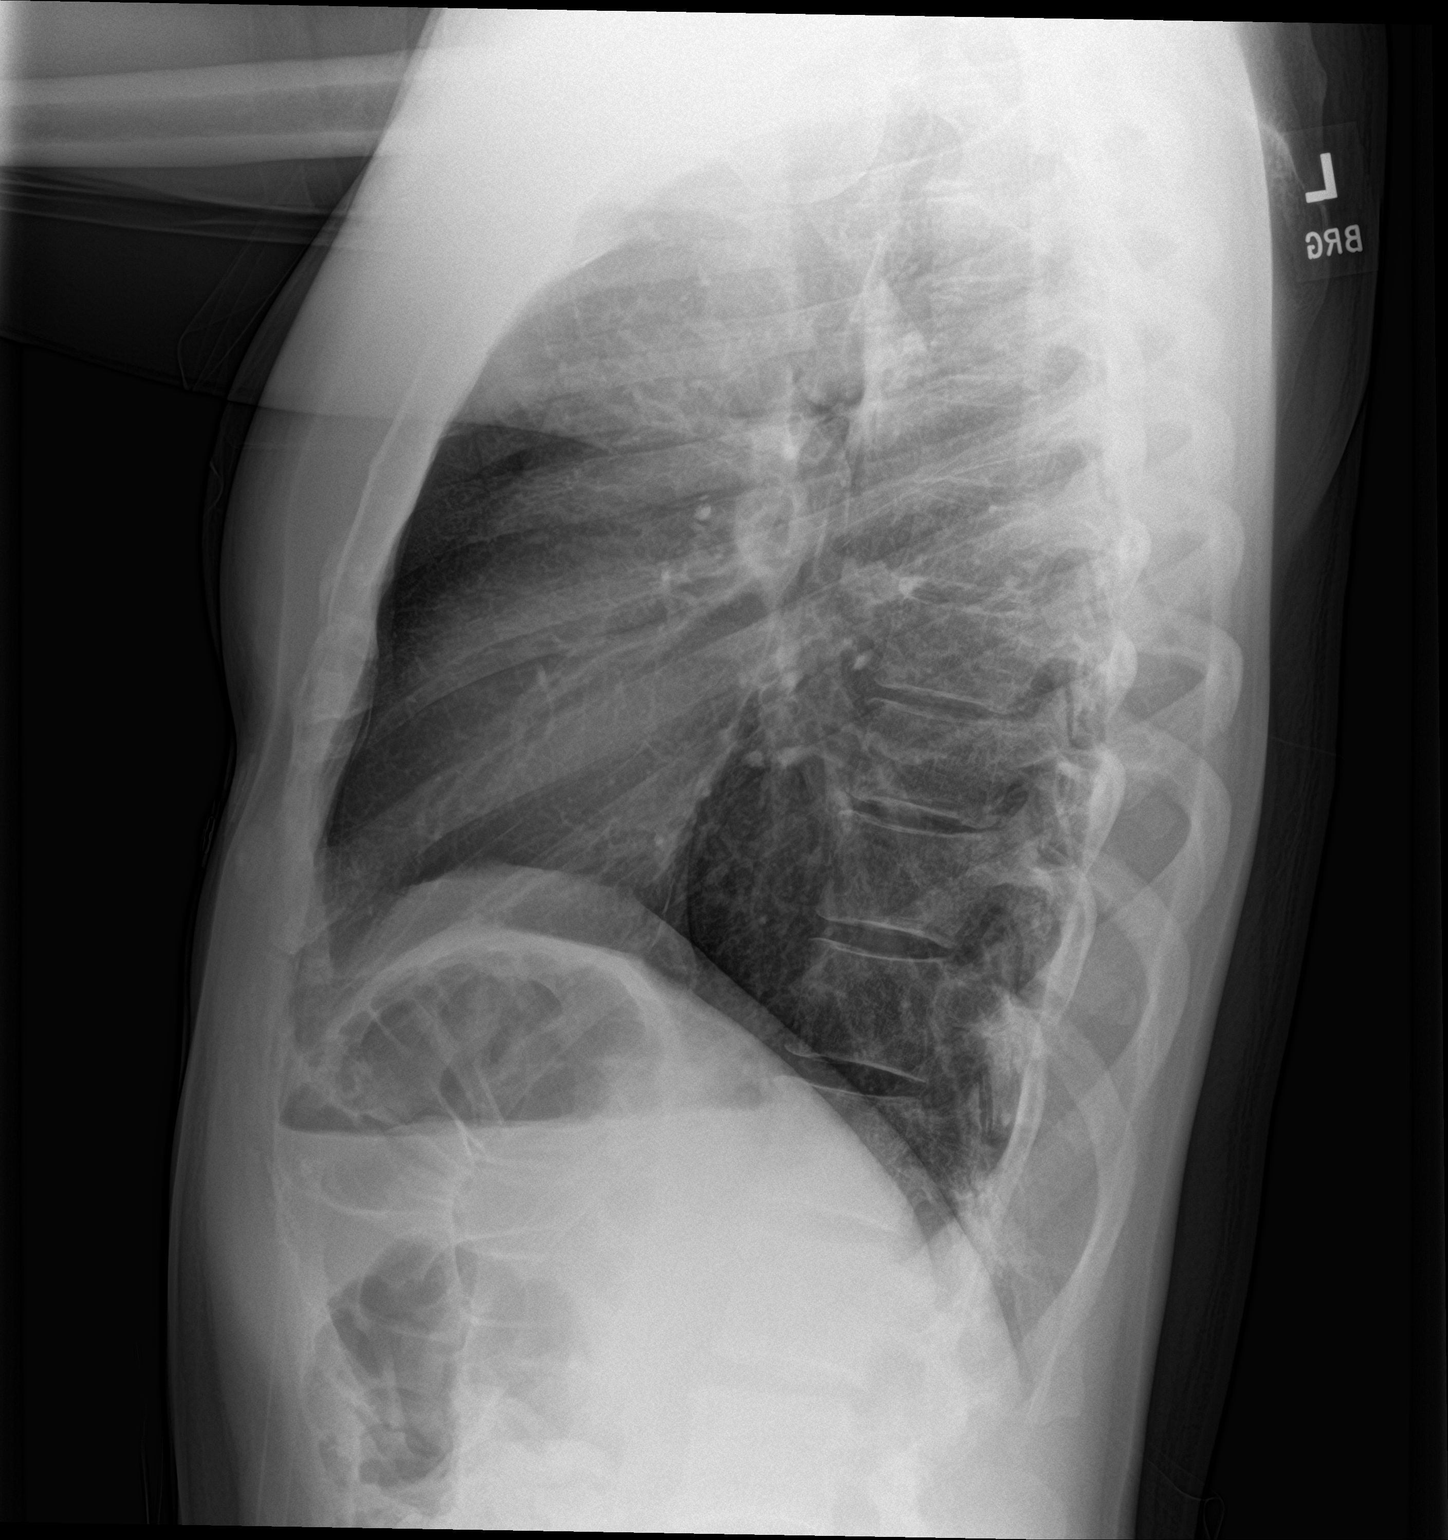

[2 of 2 positions shown; findings below may reference images not displayed]

FINDINGS: The heart size and mediastinal contours are within normal limits.
Both lungs are clear. The visualized skeletal structures are
unremarkable.
IMPRESSION: No active cardiopulmonary disease.

## 2023-10-30 ENCOUNTER — Telehealth (HOSPITAL_COMMUNITY): Payer: Self-pay

## 2023-10-30 NOTE — Telephone Encounter (Signed)
11/01/23 appt confirmed by type

## 2023-11-01 ENCOUNTER — Ambulatory Visit (HOSPITAL_COMMUNITY): Payer: BC Managed Care – PPO | Admitting: Psychiatry

## 2023-11-01 ENCOUNTER — Encounter (HOSPITAL_COMMUNITY): Payer: Self-pay | Admitting: Psychiatry

## 2023-11-01 DIAGNOSIS — F410 Panic disorder [episodic paroxysmal anxiety] without agoraphobia: Secondary | ICD-10-CM

## 2023-11-01 DIAGNOSIS — F5104 Psychophysiologic insomnia: Secondary | ICD-10-CM | POA: Diagnosis not present

## 2023-11-01 DIAGNOSIS — Z789 Other specified health status: Secondary | ICD-10-CM

## 2023-11-01 DIAGNOSIS — F411 Generalized anxiety disorder: Secondary | ICD-10-CM | POA: Diagnosis not present

## 2023-11-01 HISTORY — DX: Other specified health status: Z78.9

## 2023-11-01 MED ORDER — FLUOXETINE HCL 20 MG PO CAPS: 20.0000 mg | ORAL_CAPSULE | Freq: Every day | ORAL | 2 refills | Status: DC

## 2023-11-01 MED ORDER — PROPRANOLOL HCL 10 MG PO TABS: 10.0000 mg | ORAL_TABLET | Freq: Two times a day (BID) | ORAL | 1 refills | Status: AC | PRN

## 2023-11-01 NOTE — Patient Instructions (Addendum)
Please start to cutback on the amount of caffeine you are consuming starting with the caffeine closest to bedtime. Decrease by half a unit (a unit is a cup, can, bottle, etc whatever the caffeine is coming in) every 5 days until you are consuming no more than 1 caffeinated unit first thing in the morning. Please coordinate with your PCP to get an iron panel for your restless legs and you may want to consider a repeat of the sleep study to rule out sleep apnea.  You may also want to have a vitamin B12, folate, vitamin D level checked as low vitamin D can sometimes be the cause of hair loss.
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
## 2023-12-06 ENCOUNTER — Telehealth (HOSPITAL_COMMUNITY): Payer: 59 | Admitting: Psychiatry

## 2023-12-06 ENCOUNTER — Encounter (HOSPITAL_COMMUNITY): Payer: Self-pay | Admitting: Psychiatry

## 2023-12-06 DIAGNOSIS — F410 Panic disorder [episodic paroxysmal anxiety] without agoraphobia: Secondary | ICD-10-CM

## 2023-12-06 DIAGNOSIS — F411 Generalized anxiety disorder: Secondary | ICD-10-CM

## 2023-12-06 DIAGNOSIS — F5104 Psychophysiologic insomnia: Secondary | ICD-10-CM

## 2023-12-06 MED ORDER — FLUOXETINE HCL 20 MG PO CAPS: 20.0000 mg | ORAL_CAPSULE | Freq: Every day | ORAL | 0 refills | Status: DC

## 2023-12-06 NOTE — Patient Instructions (Signed)
We did not make any medication changes today.  Keep up the good work with cutting back on caffeine and alcohol.
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
## 2024-01-28 ENCOUNTER — Telehealth (HOSPITAL_COMMUNITY): Payer: No Typology Code available for payment source | Admitting: Psychiatry

## 2024-01-29 ENCOUNTER — Telehealth (HOSPITAL_COMMUNITY): Admitting: Psychiatry

## 2024-01-29 ENCOUNTER — Encounter (HOSPITAL_COMMUNITY): Payer: Self-pay | Admitting: Psychiatry

## 2024-01-29 DIAGNOSIS — F5104 Psychophysiologic insomnia: Secondary | ICD-10-CM

## 2024-01-29 DIAGNOSIS — F410 Panic disorder [episodic paroxysmal anxiety] without agoraphobia: Secondary | ICD-10-CM | POA: Diagnosis not present

## 2024-01-29 DIAGNOSIS — F411 Generalized anxiety disorder: Secondary | ICD-10-CM

## 2024-01-29 MED ORDER — FLUOXETINE HCL 20 MG PO CAPS: 20.0000 mg | ORAL_CAPSULE | Freq: Every day | ORAL | 0 refills | Status: DC

## 2024-01-29 NOTE — Patient Instructions (Signed)
 We did not make any medication changes today.  Keep up the good job with staying on a lower amount of caffeine.  If you are still relatively symptom free in 6 months time I would recommend trialing a 10 mg dose of fluoxetine before trying to come off of it fully.
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
## 2024-03-25 ENCOUNTER — Encounter (HOSPITAL_COMMUNITY): Payer: Self-pay | Admitting: Psychiatry

## 2024-03-25 ENCOUNTER — Telehealth (INDEPENDENT_AMBULATORY_CARE_PROVIDER_SITE_OTHER): Admitting: Psychiatry

## 2024-03-25 DIAGNOSIS — F411 Generalized anxiety disorder: Secondary | ICD-10-CM | POA: Diagnosis not present

## 2024-03-25 DIAGNOSIS — F5104 Psychophysiologic insomnia: Secondary | ICD-10-CM

## 2024-03-25 DIAGNOSIS — F410 Panic disorder [episodic paroxysmal anxiety] without agoraphobia: Secondary | ICD-10-CM

## 2024-03-25 MED ORDER — FLUOXETINE HCL 20 MG PO CAPS: 20.0000 mg | ORAL_CAPSULE | Freq: Every day | ORAL | 1 refills | Status: AC

## 2024-03-25 NOTE — Patient Instructions (Signed)
 We did not make any medication changes today.
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
## 2024-12-03 NOTE — Progress Notes (Unsigned)
 "               Odis Mace D.CLEMENTEEN [facility] [address] [place] [address] Phone: [phone]   Assessment and Plan:     1. Chronic left shoulder pain (Primary) 2. Biceps tendinopathy, left -Chronic with exacerbation, initial sports medicine visit - Left shoulder pain x 9 months with fluctuating symptoms, reaggravated over the past 1 to 2 weeks.  Most consistent with irritation of long head of biceps tendon with concern for possible SLAP tear occurring during weight lifting - Start meloxicam  15 mg daily x2 weeks.  If still having pain after 2 weeks, complete 3rd-week of NSAID. May use remaining NSAID as needed once daily for pain control.  Do not to use additional over-the-counter NSAIDs (ibuprofen, naproxen, Advil, Aleve, etc.) while taking prescription NSAIDs.  May use Tylenol [phone] mg 2 to 3 times a day for breakthrough pain. - Start HEP for shoulder  3. Left carpal tunnel syndrome -Chronic with exacerbation, initial sports medicine visit - Consistent with left carpal tunnel syndrome gradually worsening over the past several months  - Start meloxicam  15 mg daily x2 weeks.  If still having pain after 2 weeks, complete 3rd-week of NSAID. May use remaining NSAID as needed once daily for pain control.  Do not to use additional over-the-counter NSAIDs (ibuprofen, naproxen, Advil, Aleve, etc.) while taking prescription NSAIDs.  May use Tylenol [phone] mg 2 to 3 times a day for breakthrough pain. - Start HEP for carpal tunnel syndrome - Recommend wearing carpal tunnel night brace  15 additional minutes spent for educating Therapeutic Home Exercise Program.  This included exercises focusing on stretching, strengthening, with focus on eccentric aspects.   Long term goals include an improvement in range of motion, strength, endurance as well as avoiding reinjury. Patient's frequency would include in 1-2 times a day, 3-5 times a week for a duration of 6-12 weeks. Proper  technique shown and discussed handout in great detail with ATC.  All questions were discussed and answered.    Pertinent previous records reviewed include none   Follow Up: 4 weeks for reevaluation.  Could consider carpal tunnel CSI versus subacromial CSI   Subjective:   I, Jared Parsons, am serving as a neurosurgeon for Doctor Morene Mace  Chief Complaint: left shoulder pain   HPI:   12/04/2024 Patient is a 49 year old male with left shoulder pain. Patient states pain started last year. He was lifting weights. Decreased grip strength. Decreased ROM. Does endorse numbness and tingling. No meds for the pain. Notes his pain is decreased some. Does endorse painful popping sensations. No radiating pain.    Relevant Historical Information: None pertinent  Additional pertinent review of systems negative.  Current Medications[1]   Objective:     Vitals:   12/04/24 1454  Pulse: 76  SpO2: 94%  Weight: 178 lb (80.7 kg)  Height: 5' 11 (1.803 m)      Body mass index is 24.83 kg/m.    Physical Exam:    Gen: Appears well, nad, nontoxic and pleasant Neuro:sensation intact, strength is 5/5, muscle tone wnl Skin: no suspicious lesion or defmority Psych: A&O, appropriate mood and affect  Left shoulder:  No deformity, swelling or muscle wasting No scapular winging FF 180, abd 180, int 0, ext 90 NTTP over the [place], clavicle, ac, coracoid, biceps groove, humerus, deltoid, trapezius, cervical spine Mildly positive O'Brien's Neg neer, hawkins, empty can,  , crossarm, subscap liftoff, speeds Neg ant  drawer, sulcus sign, apprehension Negative Spurling's test bilat FROM of neck    Left hand/Wrist:   No deformity or swelling appreciated. ROM  Ext 90, flexion 70, radial/ulnar deviation 30 nttp over the snuff box, dorsal carpals, volar carpals, radial styloid, ulnar styloid, 1st mcp, tfcc Positive Tinel's, Negative Phalen's, Prayer Tests Negative finklestein Neg tfcc bounce test No  pain with resisted ext, flex or deviation    Electronically signed by:  Odis Mace D.[facility] 4:04 PM 12/04/24     [1]  Current Outpatient Medications:    meloxicam  (MOBIC ) 15 MG tablet, Take 1 tablet daily for 2 weeks.  If still in pain after 2 weeks, take 1 tablet daily for an additional 1 week., Disp: 30 tablet, Rfl: 0   FLUoxetine  (PROZAC ) 20 MG capsule, Take 1 capsule (20 mg total) by mouth daily., Disp: 90 capsule, Rfl: 1   magnesium 30 MG tablet, Take 30 mg by mouth 2 (two) times daily., Disp: , Rfl:    Melatonin 1 MG CAPS, Take 5 mg by mouth at bedtime., Disp: , Rfl:    Multiple Vitamins-Minerals (CENTRUM ADULT PO), Take 1 tablet by mouth daily., Disp: , Rfl:    pantoprazole (PROTONIX) 40 MG tablet, Take 40 mg by mouth daily., Disp: , Rfl:    propranolol  (INDERAL ) 10 MG tablet, Take 1 tablet (10 mg total) by mouth 2 (two) times daily as needed (anxiety/panic)., Disp: 60 tablet, Rfl: 1   sildenafil (VIAGRA) 100 MG tablet, Take 100 mg by mouth daily as needed., Disp: , Rfl:   "

## 2024-12-04 ENCOUNTER — Ambulatory Visit: Payer: PRIVATE HEALTH INSURANCE | Admitting: Sports Medicine

## 2024-12-04 VITALS — HR 76 | Ht 71.0 in | Wt 178.0 lb

## 2024-12-04 DIAGNOSIS — M25512 Pain in left shoulder: Secondary | ICD-10-CM | POA: Diagnosis not present

## 2024-12-04 DIAGNOSIS — G8929 Other chronic pain: Secondary | ICD-10-CM

## 2024-12-04 DIAGNOSIS — M67922 Unspecified disorder of synovium and tendon, left upper arm: Secondary | ICD-10-CM

## 2024-12-04 DIAGNOSIS — G5602 Carpal tunnel syndrome, left upper limb: Secondary | ICD-10-CM

## 2024-12-04 MED ORDER — MELOXICAM 15 MG PO TABS: ORAL_TABLET | ORAL | 0 refills | Status: AC

## 2024-12-04 NOTE — Patient Instructions (Addendum)
-   Start meloxicam  15 mg daily x2 weeks.  If still having pain after 2 weeks, complete 3rd-week of NSAID. May use remaining NSAID as needed once daily for pain control.  Do not to use additional over-the-counter NSAIDs (ibuprofen, naproxen, Advil, Aleve, etc.) while taking prescription NSAIDs.  May use Tylenol [phone] mg 2 to 3 times a day for breakthrough pain.  Shoulder HEP   Recommend getting a carpal tunnel brace  4 week follow up

## 2025-01-01 ENCOUNTER — Ambulatory Visit: Payer: PRIVATE HEALTH INSURANCE | Admitting: Sports Medicine
